# Patient Record
Sex: Female | Born: 1980
Health system: Southern US, Community
[De-identification: ages and names within clinical notes are randomized; demographics above are authoritative.]

## PROBLEM LIST (undated history)

## (undated) DIAGNOSIS — E669 Obesity, unspecified: Secondary | ICD-10-CM

## (undated) DIAGNOSIS — E66811 Obesity, class 1: Secondary | ICD-10-CM

## (undated) DIAGNOSIS — E282 Polycystic ovarian syndrome: Secondary | ICD-10-CM

## (undated) HISTORY — DX: Obesity, class 1: E66.811

## (undated) HISTORY — DX: Polycystic ovarian syndrome: E28.2

## (undated) HISTORY — DX: Obesity, unspecified: E66.9

---

## 2019-02-12 ENCOUNTER — Other Ambulatory Visit (HOSPITAL_COMMUNITY)
Admission: RE | Admit: 2019-02-12 | Discharge: 2019-02-12 | Disposition: A | Discharge status: Home / Self Care | Payer: BLUE CROSS/BLUE SHIELD | Source: Ambulatory Visit | Attending: Family Medicine | Admitting: Family Medicine

## 2019-02-12 ENCOUNTER — Encounter (INDEPENDENT_AMBULATORY_CARE_PROVIDER_SITE_OTHER): Payer: Self-pay

## 2019-02-12 ENCOUNTER — Ambulatory Visit: Payer: BLUE CROSS/BLUE SHIELD | Admitting: Family Medicine

## 2019-02-12 ENCOUNTER — Encounter: Payer: Self-pay | Admitting: Family Medicine

## 2019-02-12 VITALS — BP 120/68 | HR 87 | Temp 98.1°F | Resp 12 | Ht 68.0 in | Wt 226.8 lb

## 2019-02-12 DIAGNOSIS — Z23 Encounter for immunization: Secondary | ICD-10-CM | POA: Diagnosis not present

## 2019-02-12 DIAGNOSIS — N926 Irregular menstruation, unspecified: Secondary | ICD-10-CM

## 2019-02-12 DIAGNOSIS — E282 Polycystic ovarian syndrome: Secondary | ICD-10-CM | POA: Diagnosis not present

## 2019-02-12 DIAGNOSIS — Z0000 Encounter for general adult medical examination without abnormal findings: Secondary | ICD-10-CM | POA: Diagnosis not present

## 2019-02-12 DIAGNOSIS — Z113 Encounter for screening for infections with a predominantly sexual mode of transmission: Secondary | ICD-10-CM | POA: Diagnosis not present

## 2019-02-12 DIAGNOSIS — Z124 Encounter for screening for malignant neoplasm of cervix: Secondary | ICD-10-CM

## 2019-02-12 NOTE — Assessment & Plan Note (Signed)
Refer to GYN. 

## 2019-02-12 NOTE — Assessment & Plan Note (Signed)
Screening down

## 2019-02-12 NOTE — Patient Instructions (Addendum)
Check out the information at familydoctor.org entitled "Nutrition for Weight Loss: What You Need to Know about Fad Diets" Try to lose between 1-2 pounds per week by taking in fewer calories and burning off more calories You can succeed by limiting portions, limiting foods dense in calories and fat, becoming more active, and drinking 8 glasses of water a day (64 ounces) Don't skip meals, especially breakfast, as skipping meals may alter your metabolism Do not use over-the-counter weight loss pills or gimmicks that claim rapid weight loss A healthy BMI (or body mass index) is between 18.5 and 24.9 You can calculate your ideal BMI at the [place] website ClubMonetize.fr  Health Maintenance, Female Adopting a healthy lifestyle and getting preventive care can go a long way to promote health and wellness. Talk with your health care provider about what schedule of regular examinations is right for you. This is a good chance for you to check in with your provider about disease prevention and staying healthy. In between checkups, there are plenty of things you can do on your own. Experts have done a lot of research about which lifestyle changes and preventive measures are most likely to keep you healthy. Ask your health care provider for more information. Weight and diet Eat a healthy diet  Be sure to include plenty of vegetables, fruits, low-fat dairy products, and lean protein.  Do not eat a lot of foods high in solid fats, added sugars, or salt.  Get regular exercise. This is one of the most important things you can do for your health. ? Most adults should exercise for at least 150 minutes each week. The exercise should increase your heart rate and make you sweat (moderate-intensity exercise). ? Most adults should also do strengthening exercises at least twice a week. This is in addition to the moderate-intensity exercise. Maintain a healthy  weight  Body mass index (BMI) is a measurement that can be used to identify possible weight problems. It estimates body fat based on height and weight. Your health care provider can help determine your BMI and help you achieve or maintain a healthy weight.  For females 71 years of age and older: ? A BMI below 18.5 is considered underweight. ? A BMI of 18.5 to 24.9 is normal. ? A BMI of 25 to 29.9 is considered overweight. ? A BMI of 30 and above is considered obese. Watch levels of cholesterol and blood lipids  You should start having your blood tested for lipids and cholesterol at 38 years of age, then have this test every 5 years.  You may need to have your cholesterol levels checked more often if: ? Your lipid or cholesterol levels are high. ? You are older than 38 years of age. ? You are at high risk for heart disease. Cancer screening Lung Cancer  Lung cancer screening is recommended for adults 58-26 years old who are at high risk for lung cancer because of a history of smoking.  A yearly low-dose CT scan of the lungs is recommended for people who: ? Currently smoke. ? Have quit within the past 15 years. ? Have at least a 30-pack-year history of smoking. A pack year is smoking an average of one pack of cigarettes a day for 1 year.  Yearly screening should continue until it has been 15 years since you quit.  Yearly screening should stop if you develop a health problem that would prevent you from having lung cancer treatment. Breast Cancer  Practice breast self-awareness. This  means understanding how your breasts normally appear and feel.  It also means doing regular breast self-exams. Let your health care provider know about any changes, no matter how small.  If you are in your 20s or 30s, you should have a clinical breast exam (CBE) by a health care provider every 1-3 years as part of a regular health exam.  If you are 86 or older, have a CBE every year. Also consider  having a breast X-ray (mammogram) every year.  If you have a family history of breast cancer, talk to your health care provider about genetic screening.  If you are at high risk for breast cancer, talk to your health care provider about having an MRI and a mammogram every year.  Breast cancer gene (BRCA) assessment is recommended for women who have family members with BRCA-related cancers. BRCA-related cancers include: ? Breast. ? Ovarian. ? Tubal. ? Peritoneal cancers.  Results of the assessment will determine the need for genetic counseling and BRCA1 and BRCA2 testing. Cervical Cancer Your health care provider may recommend that you be screened regularly for cancer of the pelvic organs (ovaries, uterus, and vagina). This screening involves a pelvic examination, including checking for microscopic changes to the surface of your cervix (Pap test). You may be encouraged to have this screening done every 3 years, beginning at age 79.  For women ages 89-65, health care providers may recommend pelvic exams and Pap testing every 3 years, or they may recommend the Pap and pelvic exam, combined with testing for human papilloma virus (HPV), every 5 years. Some types of HPV increase your risk of cervical cancer. Testing for HPV may also be done on women of any age with unclear Pap test results.  Other health care providers may not recommend any screening for nonpregnant women who are considered low risk for pelvic cancer and who do not have symptoms. Ask your health care provider if a screening pelvic exam is right for you.  If you have had past treatment for cervical cancer or a condition that could lead to cancer, you need Pap tests and screening for cancer for at least 20 years after your treatment. If Pap tests have been discontinued, your risk factors (such as having a new sexual partner) need to be reassessed to determine if screening should resume. Some women have medical problems that increase the  chance of getting cervical cancer. In these cases, your health care provider may recommend more frequent screening and Pap tests. Colorectal Cancer  This type of cancer can be detected and often prevented.  Routine colorectal cancer screening usually begins at 38 years of age and continues through 38 years of age.  Your health care provider may recommend screening at an earlier age if you have risk factors for colon cancer.  Your health care provider may also recommend using home test kits to check for hidden blood in the stool.  A small camera at the end of a tube can be used to examine your colon directly (sigmoidoscopy or colonoscopy). This is done to check for the earliest forms of colorectal cancer.  Routine screening usually begins at age 3.  Direct examination of the colon should be repeated every 5-10 years through 38 years of age. However, you may need to be screened more often if early forms of precancerous polyps or small growths are found. Skin Cancer  Check your skin from head to toe regularly.  Tell your health care provider about any new moles or changes  in moles, especially if there is a change in a mole's shape or color.  Also tell your health care provider if you have a mole that is larger than the size of a pencil eraser.  Always use sunscreen. Apply sunscreen liberally and repeatedly throughout the day.  Protect yourself by wearing long sleeves, pants, a wide-brimmed hat, and sunglasses whenever you are outside. Heart disease, diabetes, and high blood pressure  High blood pressure causes heart disease and increases the risk of stroke. High blood pressure is more likely to develop in: ? People who have blood pressure in the high end of the normal range (130-139/85-89 mm Hg). ? People who are overweight or obese. ? People who are African American.  If you are 86-10 years of age, have your blood pressure checked every 3-5 years. If you are 19 years of age or older,  have your blood pressure checked every year. You should have your blood pressure measured twice-once when you are at a hospital or clinic, and once when you are not at a hospital or clinic. Record the average of the two measurements. To check your blood pressure when you are not at a hospital or clinic, you can use: ? An automated blood pressure machine at a pharmacy. ? A home blood pressure monitor.  If you are between 47 years and 42 years old, ask your health care provider if you should take aspirin to prevent strokes.  Have regular diabetes screenings. This involves taking a blood sample to check your fasting blood sugar level. ? If you are at a normal weight and have a low risk for diabetes, have this test once every three years after 38 years of age. ? If you are overweight and have a high risk for diabetes, consider being tested at a younger age or more often. Preventing infection Hepatitis B  If you have a higher risk for hepatitis B, you should be screened for this virus. You are considered at high risk for hepatitis B if: ? You were born in a country where hepatitis B is common. Ask your health care provider which countries are considered high risk. ? Your parents were born in a high-risk country, and you have not been immunized against hepatitis B (hepatitis B vaccine). ? You have HIV or AIDS. ? You use needles to inject street drugs. ? You live with someone who has hepatitis B. ? You have had sex with someone who has hepatitis B. ? You get hemodialysis treatment. ? You take certain medicines for conditions, including cancer, organ transplantation, and autoimmune conditions. Hepatitis C  Blood testing is recommended for: ? Everyone born from [birth year] through 1965. ? Anyone with known risk factors for hepatitis C. Sexually transmitted infections (STIs)  You should be screened for sexually transmitted infections (STIs) including gonorrhea and chlamydia if: ? You are sexually active  and are younger than 38 years of age. ? You are older than 39 years of age and your health care provider tells you that you are at risk for this type of infection. ? Your sexual activity has changed since you were last screened and you are at an increased risk for chlamydia or gonorrhea. Ask your health care provider if you are at risk.  If you do not have HIV, but are at risk, it may be recommended that you take a prescription medicine daily to prevent HIV infection. This is called pre-exposure prophylaxis (PrEP). You are considered at risk if: ? You are sexually active  and do not regularly use condoms or know the HIV status of your partner(s). ? You take drugs by injection. ? You are sexually active with a partner who has HIV. Talk with your health care provider about whether you are at high risk of being infected with HIV. If you choose to begin PrEP, you should first be tested for HIV. You should then be tested every 3 months for as long as you are taking PrEP. Pregnancy  If you are premenopausal and you may become pregnant, ask your health care provider about preconception counseling.  If you may become pregnant, take 400 to 800 micrograms (mcg) of folic acid every day.  If you want to prevent pregnancy, talk to your health care provider about birth control (contraception). Osteoporosis and menopause  Osteoporosis is a disease in which the bones lose minerals and strength with aging. This can result in serious bone fractures. Your risk for osteoporosis can be identified using a bone density scan.  If you are 45 years of age or older, or if you are at risk for osteoporosis and fractures, ask your health care provider if you should be screened.  Ask your health care provider whether you should take a calcium or vitamin D supplement to lower your risk for osteoporosis.  Menopause may have certain physical symptoms and risks.  Hormone replacement therapy may reduce some of these symptoms  and risks. Talk to your health care provider about whether hormone replacement therapy is right for you. Follow these instructions at home:  Schedule regular health, dental, and eye exams.  Stay current with your immunizations.  Do not use any tobacco products including cigarettes, chewing tobacco, or electronic cigarettes.  If you are pregnant, do not drink alcohol.  If you are breastfeeding, limit how much and how often you drink alcohol.  Limit alcohol intake to no more than 1 drink per day for nonpregnant women. One drink equals 12 ounces of beer, 5 ounces of wine, or 1 ounces of hard liquor.  Do not use street drugs.  Do not share needles.  Ask your health care provider for help if you need support or information about quitting drugs.  Tell your health care provider if you often feel depressed.  Tell your health care provider if you have ever been abused or do not feel safe at home. This information is not intended to replace advice given to you by your health care provider. Make sure you discuss any questions you have with your health care provider. Document Released: 06/10/2011 Document Revised: 05/02/2016 Document Reviewed: 08/29/2015 Elsevier Interactive Patient Education  2019 [employer]  Preventing Unhealthy [employer], Adult Staying at a healthy weight is important to your overall health. When fat builds up in your body, you may become overweight or obese. Being overweight or obese increases your risk of developing certain health problems, such as heart disease, diabetes, sleeping problems, joint problems, and some types of cancer. Unhealthy weight gain is often the result of making unhealthy food choices or not getting enough exercise. You can make changes to your lifestyle to prevent obesity and stay as healthy as possible. What nutrition changes can be made?   Eat only as much as your body needs. To do this: ? Pay attention to signs that you are hungry or  full. Stop eating as soon as you feel full. ? If you feel hungry, try drinking water first before eating. Drink enough water so your urine is clear or pale yellow. ?  Eat smaller portions. Pay attention to portion sizes when eating out. ? Look at serving sizes on food labels. Most foods contain more than one serving per container. ? Eat the recommended number of calories for your gender and activity level. For most active people, a daily total of 2,000 calories is appropriate. If you are trying to lose weight or are not very active, you may need to eat fewer calories. Talk with your health care provider or a diet and nutrition specialist (dietitian) about how many calories you need each day.  Choose healthy foods, such as: ? Fruits and vegetables. At each meal, try to fill at least half of your plate with fruits and vegetables. ? Whole grains, such as whole-wheat bread, brown rice, and quinoa. ? Lean meats, such as chicken or fish. ? Other healthy proteins, such as beans, eggs, or tofu. ? Healthy fats, such as nuts, seeds, fatty fish, and olive oil. ? Low-fat or fat-free dairy products.  Check food labels, and avoid food and drinks that: ? Are high in calories. ? Have added sugar. ? Are high in sodium. ? Have saturated fats or trans fats.  Cook foods in healthier ways, such as by baking, broiling, or grilling.  Make a meal plan for the week, and shop with a grocery list to help you stay on track with your purchases. Try to avoid going to the grocery store when you are hungry.  When grocery shopping, try to shop around the outside of the store first, where the fresh foods are. Doing this helps you to avoid prepackaged foods, which can be high in sugar, salt (sodium), and fat. What lifestyle changes can be made?   Exercise for 30 or more minutes on 5 or more days each week. Exercising may include brisk walking, yard work, biking, running, swimming, and team sports like basketball and soccer.  Ask your health care provider which exercises are safe for you.  Do muscle-strengthening activities, such as lifting weights or using resistance bands, on 2 or more days a week.  Do not use any products that contain nicotine or tobacco, such as cigarettes and e-cigarettes. If you need help quitting, ask your health care provider.  Limit alcohol intake to no more than 1 drink a day for nonpregnant women and 2 drinks a day for men. One drink equals 12 oz of beer, 5 oz of wine, or 1 oz of hard liquor.  Try to get 7-9 hours of sleep each night. What other changes can be made?  Keep a food and activity journal to keep track of: ? What you ate and how many calories you had. Remember to count the calories in sauces, dressings, and side dishes. ? Whether you were active, and what exercises you did. ? Your calorie, weight, and activity goals.  Check your weight regularly. Track any changes. If you notice you have gained weight, make changes to your diet or activity routine.  Avoid taking weight-loss medicines or supplements. Talk to your health care provider before starting any new medicine or supplement.  Talk to your health care provider before trying any new diet or exercise plan. Why are these changes important? Eating healthy, staying active, and having healthy habits can help you to prevent obesity. Those changes also:  Help you manage stress and emotions.  Help you connect with friends and family.  Improve your self-esteem.  Improve your sleep.  Prevent long-term health problems. What can happen if changes are not made? Being obese or  overweight can cause you to develop joint or bone problems, which can make it hard for you to stay active or do activities you enjoy. Being obese or overweight also puts stress on your heart and lungs and can lead to health problems like diabetes, heart disease, and some cancers. Where to find more information Talk with your health care provider or a  dietitian about healthy eating and healthy lifestyle choices. You may also find information from:  [place] Department of Agriculture, MyPlate: FormerBoss.no  American Heart Association: www.heart.org  Centers for Disease Control and Prevention: http://www.wolf.info/ Summary  Staying at a healthy weight is important to your overall health. It helps you to prevent certain diseases and health problems, such as heart disease, diabetes, joint problems, sleep disorders, and some types of cancer.  Being obese or overweight can cause you to develop joint or bone problems, which can make it hard for you to stay active or do activities you enjoy.  You can prevent unhealthy weight gain by eating a healthy diet, exercising regularly, not smoking, limiting alcohol, and getting enough sleep.  Talk with your health care provider or a dietitian for guidance about healthy eating and healthy lifestyle choices. This information is not intended to replace advice given to you by your health care provider. Make sure you discuss any questions you have with your health care provider. Document Released: 11/26/2016 Document Revised: 09/05/2017 Document Reviewed: 01/01/2017 Elsevier Interactive Patient Education  2019 [employer].

## 2019-02-12 NOTE — Progress Notes (Signed)
BP 120/68   Pulse 87   Temp 98.1 F (36.7 C) (Oral)   Resp 12   Ht _0  (1.727 m)   Wt 226 lb 12.8 oz (102.9 kg)   SpO2 94%   BMI 34.48 kg/m    Subjective:    Patient ID: Misty Reyes, female    DOB: 1981/04/09, 38 y.o.   MRN: [id number]  HPI: Misty Reyes is a 38 y.o. female  Chief Complaint  Patient presents with  . Establish Care    HPI Patient is here to establish care Positive family hx of diabetes; stays hydrated so urinates a lot; cut out juice; lots of water No blurred vision and no dry mouth Neither parent has diabetes Sister does not have diabetes Brother does not have diabetes Big breakfast this morning but no lunch  Obesity; "I need to eeercise more" She is trying to watch her eating better PCOS, "my period is off the charts"; they did US of the ovaries and she had the cysts She has the hair on face Just came on last week; not sexually active now Five day periods, always five days, heavier at the beginning and then taper Last year, it was fine, then October did not come until end of February; once skipped 6 months  USPSTF grade A and B recommendations Depression:  Depression screen [facility] 2/9 02/12/2019  Decreased Interest 0  Down, Depressed, Hopeless 0  PHQ - 2 Score 0  Altered sleeping 0  Tired, decreased energy 0  Change in appetite 0  Feeling bad or failure about yourself  0  Trouble concentrating 0  Moving slowly or fidgety/restless 0  Suicidal thoughts 0  PHQ-9 Score 0  Difficult doing work/chores Not difficult at all   Hypertension: BP Readings from Last 3 Encounters:  02/12/19 120/68   Obesity: goal 180 pounds Wt Readings from Last 3 Encounters:  02/12/19 226 lb 12.8 oz (102.9 kg)   BMI Readings from Last 3 Encounters:  02/12/19 34.48 kg/m     Skin cancer: nothing worrisome Lung cancer:  Never smoker Breast cancer: no lumps Colorectal cancer: no fam hx Cervical cancer screening: no abnormal pap smear; more than 5 years  ago BRCA gene screening: family hx of breast and/or ovarian cancer and/or metastatic prostate cancer? no HIV, hep B, hep C: check those STD testing and prevention (chl/gon/syphilis): check those Intimate partner violence: no abuse Contraception: not sexually active now Immunizations: tetanus and flu today Diet: trying to work on that Exercise: increase slowly and gradually Alcohol:    [facility] Visit from 02/12/2019 in [facility]  AUDIT-C Score  0     Tobacco use: never   Depression screen [facility] 2/9 02/12/2019  Decreased Interest 0  Down, Depressed, Hopeless 0  PHQ - 2 Score 0  Altered sleeping 0  Tired, decreased energy 0  Change in appetite 0  Feeling bad or failure about yourself  0  Trouble concentrating 0  Moving slowly or fidgety/restless 0  Suicidal thoughts 0  PHQ-9 Score 0  Difficult doing work/chores Not difficult at all   Fall Risk  02/12/2019  Falls in the past year? 0  Number falls in past yr: 0  Injury with Fall? 0    Relevant past medical, surgical, family and social history reviewed Past Medical History:  Diagnosis Date  . Obesity (BMI 30.0-34.9)     Never had any surgery  History reviewed. No pertinent surgical history.   Family History  Problem Relation Age  of Onset  . Diabetes Paternal Grandmother    Social History   Tobacco Use  . Smoking status: Never Smoker  . Smokeless tobacco: Never Used  Substance Use Topics  . Alcohol use: Never    Frequency: Never  . Drug use: Never     Office Visit from 02/12/2019 in [facility]  AUDIT-C Score  0      Interim medical history since last visit reviewed. Allergies and medications reviewed  Review of Systems Per HPI unless specifically indicated above     Objective:    BP 120/68   Pulse 87   Temp 98.1 F (36.7 C) (Oral)   Resp 12   Ht _0  (1.727 m)   Wt 226 lb 12.8 oz (102.9 kg)   SpO2 94%   BMI 34.48 kg/m   Wt Readings from Last 3 Encounters:   02/12/19 226 lb 12.8 oz (102.9 kg)    Physical Exam Constitutional:      General: She is not in acute distress.    Appearance: She is well-developed. She is obese.  HENT:     Right Ear: Hearing, tympanic membrane, ear canal and external ear normal.     Left Ear: Hearing, tympanic membrane, ear canal and external ear normal.     Mouth/Throat:     Pharynx: No posterior oropharyngeal erythema.  Eyes:     General: No scleral icterus.       Right eye: No hordeolum.        Left eye: No hordeolum.     Conjunctiva/sclera: Conjunctivae normal.  Neck:     Thyroid: No thyromegaly.     Vascular: No carotid bruit.  Cardiovascular:     Rate and Rhythm: Normal rate and regular rhythm.  No extrasystoles are present.    Heart sounds: Normal heart sounds, S1 normal and S2 normal.  Pulmonary:     Effort: Pulmonary effort is normal. No respiratory distress.     Breath sounds: Normal breath sounds.  Abdominal:     General: Bowel sounds are normal. There is no distension or abdominal bruit.     Palpations: Abdomen is soft. There is no mass or pulsatile mass.     Tenderness: There is no abdominal tenderness.     Hernia: No hernia is present.  Musculoskeletal: Normal range of motion.  Lymphadenopathy:     Head:     Right side of head: No submandibular adenopathy.     Left side of head: No submandibular adenopathy.     Cervical: No cervical adenopathy.  Skin:    General: Skin is warm and dry.     Coloration: Skin is not pale.     Findings: No bruising or ecchymosis.  Neurological:     Mental Status: She is alert.     Motor: No tremor or abnormal muscle tone.     Gait: Gait normal.     Deep Tendon Reflexes:     Reflex Scores:      Patellar reflexes are 2+ on the right side and 2+ on the left side. Psychiatric:        Mood and Affect: Mood is not anxious or depressed.        Speech: Speech normal.        Behavior: Behavior normal.        Thought Content: Thought content normal.         Assessment & Plan:   Problem List Items Addressed This Visit  Other   Screen for STD (sexually transmitted disease)    Screening down      Relevant Orders   HIV Antibody (routine testing w rflx) (Completed)   Hepatitis panel, acute   HIV Antibody (routine testing w rflx)   RPR   Cervicovaginal ancillary only   Irregular periods    Refer to GYN      Relevant Orders   Ambulatory referral to Obstetrics / Gynecology   Preventative health care - Primary    USPSTF grade A and B recommendations reviewed with patient; age-appropriate recommendations, preventive care, screening tests, etc discussed and encouraged; healthy living encouraged; see AVS for patient education given to patient GYN components requested to be done by GYN      Relevant Orders   CBC (Completed)   COMPLETE METABOLIC PANEL WITH GFR (Completed)   TSH (Completed)   Lipid panel (Completed)    Other Visit Diagnoses    Need for Tdap vaccination       Relevant Orders   Tdap vaccine greater than or equal to 7yo IM (Completed)   Need for influenza vaccination       Relevant Orders   Flu Vaccine QUAD 6+ mos PF IM (Fluarix Quad PF) (Completed)   PCOS (polycystic ovarian syndrome)       Relevant Orders   Ambulatory referral to Obstetrics / Gynecology   Cervical cancer screening       Relevant Orders   Ambulatory referral to Obstetrics / Gynecology       Follow up plan: Return in about 1 year (around 02/12/2020) for complete physical.  An after-visit summary was printed and given to the patient at [facility].  Please see the patient instructions which may contain other information and recommendations beyond what is mentioned above in the assessment and plan.  No orders of the defined types were placed in this encounter.   Orders Placed This Encounter  Procedures  . Tdap vaccine greater than or equal to 7yo IM  . Flu Vaccine QUAD 6+ mos PF IM (Fluarix Quad PF)  . CBC  . COMPLETE METABOLIC PANEL WITH GFR   . HIV Antibody (routine testing w rflx)  . TSH  . Lipid panel  . Hepatitis panel, acute  . HIV Antibody (routine testing w rflx)  . RPR  . Ambulatory referral to Obstetrics / Gynecology

## 2019-02-14 ENCOUNTER — Encounter: Payer: Self-pay | Admitting: Family Medicine

## 2019-02-14 NOTE — Assessment & Plan Note (Signed)
USPSTF grade A and B recommendations reviewed with patient; age-appropriate recommendations, preventive care, screening tests, etc discussed and encouraged; healthy living encouraged; see AVS for patient education given to patient GYN components requested to be done by GYN

## 2019-02-15 LAB — COMPLETE METABOLIC PANEL WITH GFR
AG Ratio: 1.3 (calc) (ref 1.0–2.5)
ALKALINE PHOSPHATASE (APISO): 53 U/L (ref 31–125)
ALT: 14 U/L (ref 6–29)
AST: 18 U/L (ref 10–30)
Albumin: 4.2 g/dL (ref 3.6–5.1)
BUN: 9 mg/dL (ref 7–25)
CO2: 28 mmol/L (ref 20–32)
Calcium: 9.6 mg/dL (ref 8.6–10.2)
Chloride: 105 mmol/L (ref 98–110)
Creat: 0.93 mg/dL (ref 0.50–1.10)
GFR, Est African American: 91 mL/min/{1.73_m2} (ref >=60)
GFR, Est Non African American: 79 mL/min/{1.73_m2} (ref >=60)
Globulin: 3.2 g/dL (calc) (ref 1.9–3.7)
Glucose, Bld: 88 mg/dL (ref 65–99)
Potassium: 3.9 mmol/L (ref 3.5–5.3)
Sodium: 139 mmol/L (ref 135–146)
Total Bilirubin: 0.7 mg/dL (ref 0.2–1.2)
Total Protein: 7.4 g/dL (ref 6.1–8.1)

## 2019-02-15 LAB — HIV ANTIBODY (ROUTINE TESTING W REFLEX): HIV 1&2 Ab, 4th Generation: NONREACTIVE

## 2019-02-15 LAB — CBC
HCT: 37.7 % (ref 35.0–45.0)
Hemoglobin: 12.5 g/dL (ref 11.7–15.5)
MCH: 28.4 pg (ref 27.0–33.0)
MCHC: 33.2 g/dL (ref 32.0–36.0)
MCV: 85.7 fL (ref 80.0–100.0)
MPV: 11.1 fL (ref 7.5–12.5)
Platelets: 271 10*3/uL (ref 140–400)
RBC: 4.4 10*6/uL (ref 3.80–5.10)
RDW: 12.5 % (ref 11.0–15.0)
WBC: 4.6 10*3/uL (ref 3.8–10.8)

## 2019-02-15 LAB — LIPID PANEL
Cholesterol: 193 mg/dL (ref <200)
HDL: 45 mg/dL — AB (ref >=50)
LDL Cholesterol (Calc): 132 mg/dL (calc) — ABNORMAL HIGH
Non-HDL Cholesterol (Calc): 148 mg/dL (calc) — ABNORMAL HIGH (ref <130)
Total CHOL/HDL Ratio: 4.3 (calc) (ref <5.0)
Triglycerides: 66 mg/dL (ref <150)

## 2019-02-15 LAB — HEPATITIS PANEL, ACUTE
HEP B C IGM: NONREACTIVE
Hep A IgM: NONREACTIVE
Hepatitis B Surface Ag: NONREACTIVE
Hepatitis C Ab: NONREACTIVE
SIGNAL TO CUT-OFF: 0.15 (ref <1.00)

## 2019-02-15 LAB — RPR: RPR Ser Ql: NONREACTIVE

## 2019-02-15 LAB — TSH: TSH: 1.15 mIU/L

## 2019-02-15 LAB — CERVICOVAGINAL ANCILLARY ONLY
Chlamydia: NEGATIVE
Neisseria Gonorrhea: NEGATIVE
Trichomonas: NEGATIVE

## 2019-02-16 ENCOUNTER — Telehealth: Payer: Self-pay | Admitting: Obstetrics & Gynecology

## 2019-02-16 NOTE — Telephone Encounter (Signed)
[facility] referring for PCOS, irregular periods, cervical cancer screen, breast exam

## 2019-03-01 ENCOUNTER — Encounter: Payer: Self-pay | Admitting: Obstetrics and Gynecology

## 2019-03-03 NOTE — Telephone Encounter (Signed)
Appointment was cancel due COVID virus pandemic. Left voicemail for patient to call back to reschedule 2 months out due to covid virus. Called and left voicemail for patient to call back to be schedule

## 2019-03-25 ENCOUNTER — Ambulatory Visit (INDEPENDENT_AMBULATORY_CARE_PROVIDER_SITE_OTHER): Payer: BLUE CROSS/BLUE SHIELD | Admitting: Obstetrics and Gynecology

## 2019-03-25 ENCOUNTER — Other Ambulatory Visit: Payer: Self-pay

## 2019-03-25 ENCOUNTER — Encounter: Payer: Self-pay | Admitting: Obstetrics and Gynecology

## 2019-03-25 ENCOUNTER — Other Ambulatory Visit (HOSPITAL_COMMUNITY)
Admission: RE | Admit: 2019-03-25 | Discharge: 2019-03-25 | Disposition: A | Discharge status: Home / Self Care | Payer: BLUE CROSS/BLUE SHIELD | Source: Ambulatory Visit | Attending: Obstetrics and Gynecology | Admitting: Obstetrics and Gynecology

## 2019-03-25 VITALS — BP 140/84 | HR 76 | Ht 70.0 in | Wt 233.0 lb

## 2019-03-25 DIAGNOSIS — Z124 Encounter for screening for malignant neoplasm of cervix: Secondary | ICD-10-CM | POA: Insufficient documentation

## 2019-03-25 DIAGNOSIS — Z01419 Encounter for gynecological examination (general) (routine) without abnormal findings: Secondary | ICD-10-CM

## 2019-03-25 DIAGNOSIS — Z1151 Encounter for screening for human papillomavirus (HPV): Secondary | ICD-10-CM

## 2019-03-25 DIAGNOSIS — E282 Polycystic ovarian syndrome: Secondary | ICD-10-CM | POA: Insufficient documentation

## 2019-03-25 MED ORDER — MEDROXYPROGESTERONE ACETATE 10 MG PO TABS: 10.0000 mg | ORAL_TABLET | Freq: Every day | ORAL | 0 refills | Status: DC

## 2019-03-25 NOTE — Patient Instructions (Signed)
I value your feedback and entrusting us with your care. If you get a [place] patient survey, I would appreciate you taking the time to let us know about your experience today. Thank you! 

## 2019-03-25 NOTE — Progress Notes (Signed)
PCP:  Kerman PasseyLada, Melinda P, MD   Chief Complaint  Patient presents with  . Pap only  . Breast exam    has never had a breast exam  . Metrorrhagia    sometimes gets 2 periods a yr, other times a period every 3 months     HPI:      Ms. Misty Reyes is a 38 y.o. [id number] who LMP was Patient's last menstrual period was 03/08/2019 (exact date)., presents today for her NP annual examination, referred by PCP for irreg menses, pap and breast exam.  Her menses are Q3-6 months due to PCOS, lasting 5 days, light flow. Sx since 2006.  Dysmenorrhea mild, occurring first 1-2 days of flow. She does not have intermenstrual bleeding. Has hirsutism and cysts on ovaries on u/s, per pt report. Has never done any tx for it. Is not interested in birth control/hormones for cycle control.  Sex activity: not sexually active.  Last Pap: not recent; no hx of abn Hx of STDs: none; neg STD testing 3/20 with PCP  There is no FH of breast cancer. There is no FH of ovarian cancer. The patient does do self-breast exams.  Tobacco use: The patient denies current or previous tobacco use. Alcohol use: none No drug use.  Exercise: not active  She does get adequate calcium but not Vitamin D in her diet. Labs with PCP. Elevated LDL.  Past Medical History:  Diagnosis Date  . Obesity (BMI 30.0-34.9)   . PCOS (polycystic ovarian syndrome)     History reviewed. No pertinent surgical history.  Family History  Problem Relation Age of Onset  . Diabetes Paternal Grandmother   . Breast cancer Neg Hx   . Ovarian cancer Neg Hx     Social History   Socioeconomic History  . Marital status: Single    Spouse name: Not on file  . Number of children: Not on file  . Years of education: 1012  . Highest education level: Bachelor's degree (e.g., BA, AB, BS)  Occupational History  . Not on file  Social Needs  . Financial resource strain: Not hard at all  . Food insecurity:    Worry: Never true    Inability: Never true  .  Transportation needs:    Medical: No    Non-medical: No  Tobacco Use  . Smoking status: Never Smoker  . Smokeless tobacco: Never Used  Substance and Sexual Activity  . Alcohol use: Never    Frequency: Never  . Drug use: Never  . Sexual activity: Not Currently    Birth control/protection: None  Lifestyle  . Physical activity:    Days per week: 0 days    Minutes per session: 0 min  . Stress: Not at all  Relationships  . Social connections:    Talks on phone: Once a week    Gets together: Once a week    Attends religious service: Never    Active member of club or organization: No    Attends meetings of clubs or organizations: Never    Relationship status: Not on file  . Intimate partner violence:    Fear of current or ex partner: No    Emotionally abused: No    Physically abused: No    Forced sexual activity: No  Other Topics Concern  . Not on file  Social History Narrative  . Not on file    No outpatient medications prior to visit.   No facility-administered medications prior to visit.  ROS:  Review of Systems  Constitutional: Negative for fatigue, fever and unexpected weight change.  Respiratory: Negative for cough, shortness of breath and wheezing.   Cardiovascular: Negative for chest pain, palpitations and leg swelling.  Gastrointestinal: Negative for blood in stool, constipation, diarrhea, nausea and vomiting.  Endocrine: Negative for cold intolerance, heat intolerance and polyuria.  Genitourinary: Positive for menstrual problem. Negative for dyspareunia, dysuria, flank pain, frequency, genital sores, hematuria, pelvic pain, urgency, vaginal bleeding, vaginal discharge and vaginal pain.  Musculoskeletal: Negative for back pain, joint swelling and myalgias.  Skin: Negative for rash.  Neurological: Negative for dizziness, syncope, light-headedness, numbness and headaches.  Hematological: Negative for adenopathy.  Psychiatric/Behavioral: Negative for  agitation, confusion, sleep disturbance and suicidal ideas. The patient is not nervous/anxious.   BREAST: No symptoms   Objective: BP 140/84   Pulse 76   Ht 5\' 10"  (1.778 m)   Wt 233 lb (105.7 kg)   LMP 03/08/2019 (Exact Date)   BMI 33.43 kg/m    Physical Exam Constitutional:      Appearance: She is well-developed.  Genitourinary:     Vulva, vagina, cervix, uterus, right adnexa and left adnexa normal.     No vulval lesion or tenderness noted.     No vaginal discharge, erythema or tenderness.     No cervical polyp.     Uterus is not enlarged or tender.     No right or left adnexal mass present.     Right adnexa not tender.     Left adnexa not tender.  Neck:     Musculoskeletal: Normal range of motion.     Thyroid: No thyromegaly.  Cardiovascular:     Rate and Rhythm: Normal rate and regular rhythm.     Heart sounds: Normal heart sounds. No murmur.  Pulmonary:     Effort: Pulmonary effort is normal.     Breath sounds: Normal breath sounds.  Chest:     Breasts:        Right: No mass, nipple discharge, skin change or tenderness.        Left: No mass, nipple discharge, skin change or tenderness.  Abdominal:     Palpations: Abdomen is soft.     Tenderness: There is no abdominal tenderness. There is no guarding.  Musculoskeletal: Normal range of motion.  Neurological:     General: No focal deficit present.     Mental Status: She is alert and oriented to person, place, and time.     Cranial Nerves: No cranial nerve deficit.  Skin:    General: Skin is warm and dry.  Psychiatric:        Mood and Affect: Mood normal.        Behavior: Behavior normal.        Thought Content: Thought content normal.        Judgment: Judgment normal.  Vitals signs reviewed.     Assessment/Plan: Encounter for annual routine gynecological examination  Cervical cancer screening - Plan: Cytology - PAP  Screening for HPV (human papillomavirus) - Plan: Cytology - PAP  PCOS (polycystic  ovarian syndrome) - With irreg menses. Pt declines BC for mgmt. Will do Rx provera Q90 days for withdrawal bleed if no spont menses. Rx eRxd. Pt understands how to use. F/u prn.  - Plan: medroxyPROGESTERone (PROVERA) 10 MG tablet  Meds ordered this encounter  Medications  . medroxyPROGESTERone (PROVERA) 10 MG tablet    Sig: Take 1 tablet (10 mg total) by mouth daily for 7  days. Q90 days if no spontaneous menses    Dispense:  28 tablet    Refill:  0    Order Specific Question:   Supervising Provider    Answer:   Nadara Mustard [[id number]]             GYN counsel adequate intake of calcium and vitamin D, diet and exercise     F/U  Return in about 1 year (around 03/24/2020).  Renessa Wellnitz B. Laisha Rau, PA-C 03/25/2019 10:46 AM

## 2019-03-27 LAB — CYTOLOGY - PAP
Diagnosis: NEGATIVE
HPV: DETECTED — AB

## 2019-03-30 ENCOUNTER — Telehealth: Payer: Self-pay | Admitting: Obstetrics and Gynecology

## 2019-03-31 ENCOUNTER — Encounter: Payer: Self-pay | Admitting: Obstetrics and Gynecology

## 2019-04-05 NOTE — Telephone Encounter (Signed)
LMTRC re: pap results.  

## 2020-02-10 ENCOUNTER — Encounter: Payer: Self-pay | Admitting: Nurse Practitioner

## 2020-02-10 ENCOUNTER — Ambulatory Visit (INDEPENDENT_AMBULATORY_CARE_PROVIDER_SITE_OTHER): Payer: BC Managed Care – PPO | Admitting: Nurse Practitioner

## 2020-02-10 DIAGNOSIS — E282 Polycystic ovarian syndrome: Secondary | ICD-10-CM

## 2020-02-10 DIAGNOSIS — E669 Obesity, unspecified: Secondary | ICD-10-CM | POA: Diagnosis not present

## 2020-02-10 DIAGNOSIS — Z0000 Encounter for general adult medical examination without abnormal findings: Secondary | ICD-10-CM | POA: Diagnosis not present

## 2020-02-10 DIAGNOSIS — Z7689 Persons encountering health services in other specified circumstances: Secondary | ICD-10-CM | POA: Diagnosis not present

## 2020-02-10 NOTE — Patient Instructions (Signed)
Healthy Eating Following a healthy eating pattern may help you to achieve and maintain a healthy body weight, reduce the risk of chronic disease, and live a long and productive life. It is important to follow a healthy eating pattern at an appropriate calorie level for your body. Your nutritional needs should be met primarily through food by choosing a variety of nutrient-rich foods. What are tips for following this plan? Reading food labels  Read labels and choose the following: ? Reduced or low sodium. ? Juices with 100% fruit juice. ? Foods with low saturated fats and high polyunsaturated and monounsaturated fats. ? Foods with whole grains, such as whole wheat, cracked wheat, brown rice, and wild rice. ? Whole grains that are fortified with folic acid. This is recommended for women who are pregnant or who want to become pregnant.  Read labels and avoid the following: ? Foods with a lot of added sugars. These include foods that contain brown sugar, corn sweetener, corn syrup, dextrose, fructose, glucose, high-fructose corn syrup, honey, invert sugar, lactose, malt syrup, maltose, molasses, raw sugar, sucrose, trehalose, or turbinado sugar.  Do not eat more than the following amounts of added sugar per day:  6 teaspoons (25 g) for women.  9 teaspoons (38 g) for men. ? Foods that contain processed or refined starches and grains. ? Refined grain products, such as white flour, degermed cornmeal, white bread, and white rice. Shopping  Choose nutrient-rich snacks, such as vegetables, whole fruits, and nuts. Avoid high-calorie and high-sugar snacks, such as potato chips, fruit snacks, and candy.  Use oil-based dressings and spreads on foods instead of solid fats such as butter, stick margarine, or cream cheese.  Limit pre-made sauces, mixes, and "instant" products such as flavored rice, instant noodles, and ready-made pasta.  Try more plant-protein sources, such as tofu, tempeh, black beans,  edamame, lentils, nuts, and seeds.  Explore eating plans such as the Mediterranean diet or vegetarian diet. Cooking  Use oil to saut or stir-fry foods instead of solid fats such as butter, stick margarine, or lard.  Try baking, boiling, grilling, or broiling instead of frying.  Remove the fatty part of meats before cooking.  Steam vegetables in water or broth. Meal planning   At meals, imagine dividing your plate into fourths: ? One-half of your plate is fruits and vegetables. ? One-fourth of your plate is whole grains. ? One-fourth of your plate is protein, especially lean meats, poultry, eggs, tofu, beans, or nuts.  Include low-fat dairy as part of your daily diet. Lifestyle  Choose healthy options in all settings, including home, work, school, restaurants, or stores.  Prepare your food safely: ? Wash your hands after handling raw meats. ? Keep food preparation surfaces clean by regularly washing with hot, soapy water. ? Keep raw meats separate from ready-to-eat foods, such as fruits and vegetables. ? Cook seafood, meat, poultry, and eggs to the recommended internal temperature. ? Store foods at safe temperatures. In general:  Keep cold foods at 59F (4.4C) or below.  Keep hot foods at 159F (60C) or above.  Keep your freezer at [facility] (-17.8C) or below.  Foods are no longer safe to eat when they have been between the temperatures of 40-159F (4.4-60C) for more than 2 hours. What foods should I eat? Fruits Aim to eat 2 cup-equivalents of fresh, canned (in natural juice), or frozen fruits each day. Examples of 1 cup-equivalent of fruit include 1 small apple, 8 large strawberries, 1 cup canned fruit,  cup  dried fruit, or 1 cup 100% juice. Vegetables Aim to eat 2-3 cup-equivalents of fresh and frozen vegetables each day, including different varieties and colors. Examples of 1 cup-equivalent of vegetables include 2 medium carrots, 2 cups raw, leafy greens, 1 cup chopped  vegetable (raw or cooked), or 1 medium baked potato. Grains Aim to eat 6 ounce-equivalents of whole grains each day. Examples of 1 ounce-equivalent of grains include 1 slice of bread, 1 cup ready-to-eat cereal, 3 cups popcorn, or  cup cooked rice, pasta, or cereal. Meats and other proteins Aim to eat 5-6 ounce-equivalents of protein each day. Examples of 1 ounce-equivalent of protein include 1 egg, 1/2 cup nuts or seeds, or 1 tablespoon (16 g) peanut butter. A cut of meat or fish that is the size of a deck of cards is about 3-4 ounce-equivalents.  Of the protein you eat each week, try to have at least 8 ounces come from seafood. This includes salmon, trout, herring, and anchovies. Dairy Aim to eat 3 cup-equivalents of fat-free or low-fat dairy each day. Examples of 1 cup-equivalent of dairy include 1 cup (240 mL) milk, 8 ounces (250 g) yogurt, 1 ounces (44 g) natural cheese, or 1 cup (240 mL) fortified soy milk. Fats and oils  Aim for about 5 teaspoons (21 g) per day. Choose monounsaturated fats, such as canola and olive oils, avocados, peanut butter, and most nuts, or polyunsaturated fats, such as sunflower, corn, and soybean oils, walnuts, pine nuts, sesame seeds, sunflower seeds, and flaxseed. Beverages  Aim for six 8-oz glasses of water per day. Limit coffee to three to five 8-oz cups per day.  Limit caffeinated beverages that have added calories, such as soda and energy drinks.  Limit alcohol intake to no more than 1 drink a day for nonpregnant women and 2 drinks a day for men. One drink equals 12 oz of beer (355 mL), 5 oz of wine (148 mL), or 1 oz of hard liquor (44 mL). Seasoning and other foods  Avoid adding excess amounts of salt to your foods. Try flavoring foods with herbs and spices instead of salt.  Avoid adding sugar to foods.  Try using oil-based dressings, sauces, and spreads instead of solid fats. This information is based on general [place] nutrition guidelines. For more  information, visit [employer]. Exact amounts may vary based on your nutrition needs. Summary  A healthy eating plan may help you to maintain a healthy weight, reduce the risk of chronic diseases, and stay active throughout your life.  Plan your meals. Make sure you eat the right portions of a variety of nutrient-rich foods.  Try baking, boiling, grilling, or broiling instead of frying.  Choose healthy options in all settings, including home, work, school, restaurants, or stores. This information is not intended to replace advice given to you by your health care provider. Make sure you discuss any questions you have with your health care provider. Document Revised: 03/09/2018 Document Reviewed: 03/09/2018 Elsevier Patient Education  [place].

## 2020-02-10 NOTE — Progress Notes (Signed)
New Patient Office Visit  Subjective:  Patient ID: Misty Reyes, female    DOB: November 20, 1981  Age: 39 y.o. MRN: [id number]  CC:  Chief Complaint  Patient presents with  . Establish Care    no concerns    . This visit was completed via MyChart due to the restrictions of the COVID-19 pandemic. All issues as above were discussed and addressed. Physical exam was done as above through visual confirmation on MyChart. If it was felt that the patient should be evaluated in the office, they were directed there. The patient verbally consented to this visit. . Location of the patient: home . Location of the provider: home . Those involved with this call:  . Provider: Aura Dials, DNP . CMA: Wilhemena Durie, CMA . Front Desk/Registration: Adela Ports  . Time spent on call: 30 minutes with patient face to face via video conference. More than 50% of this time was spent in counseling and coordination of care. 10 minutes total spent in review of patient's record and preparation of their chart.   HPI Misty Reyes presents for new patient visit to establish care.  Introduced to Publishing rights manager role and practice setting.  All questions answered.  Was receiving care with Dr. Sherie Don.  Has not health concerns at this time, at baseline does annual physical visits only with PCP.  Does see GYN, Alicia Copland PA-C, for pap exams.  Her last pap was April 2020 and was negative cytology and positive fir HPV == she is to return in one year (April 2021) for repeat.  Has underlying PCOS, no recent A1C on review of records, last glucose on labs was 88.  Has started going to gym 3 days a week and working out, started this two weeks ago.   Past Medical History:  Diagnosis Date  . Obesity (BMI 30.0-34.9)   . PCOS (polycystic ovarian syndrome)     History reviewed. No pertinent surgical history.  Family History  Problem Relation Age of Onset  . Diabetes Paternal Grandmother   . Breast cancer Neg Hx    . Ovarian cancer Neg Hx     Social History   Socioeconomic History  . Marital status: Single    Spouse name: Not on file  . Number of children: Not on file  . Years of education: 54  . Highest education level: Bachelor's degree (e.g., BA, AB, BS)  Occupational History  . Not on file  Tobacco Use  . Smoking status: Never Smoker  . Smokeless tobacco: Never Used  Substance and Sexual Activity  . Alcohol use: Never  . Drug use: Never  . Sexual activity: Not Currently    Birth control/protection: None  Other Topics Concern  . Not on file  Social History Narrative  . Not on file   Social Determinants of Health   Financial Resource Strain: Low Risk   . Difficulty of Paying Living Expenses: Not hard at all  Food Insecurity: No Food Insecurity  . Worried About Programme researcher, broadcasting/film/video in the Last Year: Never true  . Ran Out of Food in the Last Year: Never true  Transportation Needs: No Transportation Needs  . Lack of Transportation (Medical): No  . Lack of Transportation (Non-Medical): No  Physical Activity: Insufficiently Active  . Days of Exercise per Week: 3 days  . Minutes of Exercise per Session: 30 min  Stress: No Stress Concern Present  . Feeling of Stress : Not at all  Social Connections: Unknown  . Frequency  of Communication with Friends and Family: Once a week  . Frequency of Social Gatherings with Friends and Family: Once a week  . Attends Religious Services: Never  . Active Member of Clubs or Organizations: No  . Attends Banker Meetings: Never  . Marital Status: Not on file  Intimate Partner Violence: Not At Risk  . Fear of Current or Ex-Partner: No  . Emotionally Abused: No  . Physically Abused: No  . Sexually Abused: No    ROS Review of Systems  Constitutional: Negative for activity change, appetite change, diaphoresis, fatigue and fever.  Respiratory: Negative for cough, chest tightness and shortness of breath.   Cardiovascular: Negative for  chest pain, palpitations and leg swelling.  Gastrointestinal: Negative.   Endocrine: Negative.   Neurological: Negative.   Psychiatric/Behavioral: Negative.     Objective:   Today's Vitals: There were no vitals taken for this visit.  Physical Exam Vitals and nursing note reviewed.  Constitutional:      General: She is awake. She is not in acute distress.    Appearance: She is well-developed. She is not ill-appearing.  HENT:     Head: Normocephalic.     Right Ear: Hearing normal.     Left Ear: Hearing normal.  Eyes:     General: Lids are normal.        Right eye: No discharge.        Left eye: No discharge.     Conjunctiva/sclera: Conjunctivae normal.  Pulmonary:     Effort: Pulmonary effort is normal. No accessory muscle usage or respiratory distress.  Musculoskeletal:     Cervical back: Normal range of motion.  Neurological:     Mental Status: She is alert and oriented to person, place, and time.  Psychiatric:        Attention and Perception: Attention normal.        Mood and Affect: Mood normal.        Behavior: Behavior normal. Behavior is cooperative.        Thought Content: Thought content normal.        Judgment: Judgment normal.     Assessment & Plan:   Problem List Items Addressed This Visit      Endocrine   PCOS (polycystic ovarian syndrome)    Will have patient return in 3 weeks for annual physical and obtain A1C.  Continue collaboration with GYN.        Other   Preventative health care    On review is up to date on all USPSTF grade A and B recommendations.        Obesity (BMI 30.0-34.9)    Recommend continued focus on healthy diet choices and regular physical activity (30 minutes 5 days a week).        Other Visit Diagnoses    Encounter to establish care    -  Primary      Outpatient Encounter Medications as of 02/10/2020  Medication Sig  . [DISCONTINUED] medroxyPROGESTERone (PROVERA) 10 MG tablet Take 1 tablet (10 mg total) by mouth daily  for 7 days. Q90 days if no spontaneous menses   No facility-administered encounter medications on file as of 02/10/2020.   I discussed the assessment and treatment plan with the patient. The patient was provided an opportunity to ask questions and all were answered. The patient agreed with the plan and demonstrated an understanding of the instructions.   The patient was advised to call back or seek an in-person evaluation if  the symptoms worsen or if the condition fails to improve as anticipated.   I provided 30+ minutes of time during this encounter.  Follow-up: Return in about 4 weeks (around 03/09/2020) for Annual physical.   Venita Lick, NP

## 2020-02-10 NOTE — Assessment & Plan Note (Signed)
On review is up to date on all USPSTF grade A and B recommendations.

## 2020-02-10 NOTE — Assessment & Plan Note (Addendum)
Will have patient return in 3 weeks for annual physical and obtain A1C.  Continue collaboration with GYN.

## 2020-02-10 NOTE — Assessment & Plan Note (Signed)
Recommend continued focus on healthy diet choices and regular physical activity (30 minutes 5 days a week).  

## 2020-02-14 ENCOUNTER — Encounter: Payer: BLUE CROSS/BLUE SHIELD | Admitting: Family Medicine

## 2020-03-10 ENCOUNTER — Encounter: Payer: BC Managed Care – PPO | Admitting: Nurse Practitioner

## 2020-03-13 ENCOUNTER — Ambulatory Visit: Payer: Self-pay | Admitting: *Deleted

## 2020-03-13 ENCOUNTER — Encounter: Payer: BC Managed Care – PPO | Admitting: Nurse Practitioner

## 2020-03-13 NOTE — Telephone Encounter (Signed)
INFORMATION ONLY

## 2020-03-13 NOTE — Telephone Encounter (Signed)
Per initial encounter, "Pt called and stated that she has labs tomorrow and a covid vaccine and would like to know if vaccine would affect labs"; contacted pt and she states that her vaccine is the morning of 03/14/20, and labs the same afternoon; explained that COVID vaccination does not affect lab values; she verbalized understanding.  Reason for Disposition . Question about upcoming scheduled test, no triage required and triager able to answer question  Answer Assessment - Initial Assessment Questions 1. REASON FOR CALL or QUESTION: "What is your reason for calling today?" or "How can I best help you?" or "What question do you have that I can help answer?"     Does COVID vaccine affect labs?  Protocols used: INFORMATION ONLY CALL - NO TRIAGE-A-AH

## 2020-03-13 NOTE — Telephone Encounter (Signed)
Noted, thank you

## 2020-03-13 NOTE — Telephone Encounter (Signed)
Your patient 

## 2020-03-14 ENCOUNTER — Encounter: Payer: BC Managed Care – PPO | Admitting: Nurse Practitioner

## 2020-03-21 ENCOUNTER — Other Ambulatory Visit: Payer: Self-pay

## 2020-03-21 ENCOUNTER — Encounter: Payer: Self-pay | Admitting: Nurse Practitioner

## 2020-03-21 ENCOUNTER — Ambulatory Visit (INDEPENDENT_AMBULATORY_CARE_PROVIDER_SITE_OTHER): Payer: BC Managed Care – PPO | Admitting: Nurse Practitioner

## 2020-03-21 VITALS — BP 127/89 | HR 76 | Temp 97.6°F | Ht 68.0 in | Wt 235.0 lb

## 2020-03-21 DIAGNOSIS — E559 Vitamin D deficiency, unspecified: Secondary | ICD-10-CM | POA: Diagnosis not present

## 2020-03-21 DIAGNOSIS — E66811 Obesity, class 1: Secondary | ICD-10-CM

## 2020-03-21 DIAGNOSIS — E049 Nontoxic goiter, unspecified: Secondary | ICD-10-CM | POA: Diagnosis not present

## 2020-03-21 DIAGNOSIS — Z136 Encounter for screening for cardiovascular disorders: Secondary | ICD-10-CM | POA: Diagnosis not present

## 2020-03-21 DIAGNOSIS — Z0000 Encounter for general adult medical examination without abnormal findings: Secondary | ICD-10-CM

## 2020-03-21 DIAGNOSIS — E669 Obesity, unspecified: Secondary | ICD-10-CM

## 2020-03-21 DIAGNOSIS — E282 Polycystic ovarian syndrome: Secondary | ICD-10-CM | POA: Diagnosis not present

## 2020-03-21 NOTE — Progress Notes (Signed)
BP 127/89   Pulse 76   Temp 97.6 F (36.4 C) (Oral)   Ht 5\' 8"  (1.727 m)   Wt 235 lb (106.6 kg)   SpO2 100%   BMI 35.73 kg/m    Subjective:    Patient ID: , female    DOB: 1980-12-28, 39 y.o.   MRN: [id number]  HPI: Misty Reyes is a 39 y.o. female presenting on 03/21/2020 for comprehensive medical examination. Current medical complaints include:none  She currently lives with: sister and nephew Menopausal Symptoms: no   Presents today for physical exam.  Has history of PCOS, no recent A1C on review.  Is followed by GYN, saw PA Copeland in April 2020 for pap.  Pap on 03/25/2019 did note positive HPV and negative cytology, will plan to repeat this in 4 weeks.  Depression Screen done today and results listed below:  Depression screen [facility] 2/9 03/21/2020 02/10/2020 02/12/2019  Decreased Interest 0 0 0  Down, Depressed, Hopeless 1 0 0  PHQ - 2 Score 1 0 0  Altered sleeping 0 - 0  Tired, decreased energy 0 - 0  Change in appetite 0 - 0  Feeling bad or failure about yourself  0 - 0  Trouble concentrating 0 - 0  Moving slowly or fidgety/restless 0 - 0  Suicidal thoughts 0 - 0  PHQ-9 Score 1 - 0  Difficult doing work/chores Not difficult at all - Not difficult at all    The patient does not have a history of falls. I did not complete a risk assessment for falls. A plan of care for falls was not documented.   Past Medical History:  Past Medical History:  Diagnosis Date  . Obesity (BMI 30.0-34.9)   . PCOS (polycystic ovarian syndrome)     Surgical History:  History reviewed. No pertinent surgical history.  Medications:  No current outpatient medications on file prior to visit.   No current facility-administered medications on file prior to visit.    Allergies:  No Known Allergies  Social History:  Social History   Socioeconomic History  . Marital status: Single    Spouse name: Not on file  . Number of children: Not on file  . Years of education: 47  .  Highest education level: Bachelor's degree (e.g., BA, AB, BS)  Occupational History  . Not on file  Tobacco Use  . Smoking status: Never Smoker  . Smokeless tobacco: Never Used  Substance and Sexual Activity  . Alcohol use: Never  . Drug use: Never  . Sexual activity: Not Currently    Birth control/protection: None  Other Topics Concern  . Not on file  Social History Narrative  . Not on file   Social Determinants of Health   Financial Resource Strain:   . Difficulty of Paying Living Expenses:   Food Insecurity:   . Worried About 14 in the Last Year:   . Programme researcher, broadcasting/film/video in the Last Year:   Transportation Needs:   . Barista (Medical):   Freight forwarder Lack of Transportation (Non-Medical):   Physical Activity: Insufficiently Active  . Days of Exercise per Week: 3 days  . Minutes of Exercise per Session: 30 min  Stress:   . Feeling of Stress :   Social Connections:   . Frequency of Communication with Friends and Family:   . Frequency of Social Gatherings with Friends and Family:   . Attends Religious Services:   . Active Member of Clubs  or Organizations:   . Attends Banker Meetings:   Marland Kitchen Marital Status:   Intimate Partner Violence:   . Fear of Current or Ex-Partner:   . Emotionally Abused:   Marland Kitchen Physically Abused:   . Sexually Abused:    Social History   Tobacco Use  Smoking Status Never Smoker  Smokeless Tobacco Never Used   Social History   Substance and Sexual Activity  Alcohol Use Never    Family History:  Family History  Problem Relation Age of Onset  . Diabetes Paternal Grandmother   . Breast cancer Neg Hx   . Ovarian cancer Neg Hx     Past medical history, surgical history, medications, allergies, family history and social history reviewed with patient today and changes made to appropriate areas of the chart.   Review of Systems - negative All other ROS negative except what is listed above and in the HPI.        Objective:    BP 127/89   Pulse 76   Temp 97.6 F (36.4 C) (Oral)   Ht 5\' 8"  (1.727 m)   Wt 235 lb (106.6 kg)   SpO2 100%   BMI 35.73 kg/m   Wt Readings from Last 3 Encounters:  03/21/20 235 lb (106.6 kg)  03/25/19 233 lb (105.7 kg)  02/12/19 226 lb 12.8 oz (102.9 kg)    Physical Exam Constitutional:      General: She is awake. She is not in acute distress.    Appearance: She is well-developed. She is not ill-appearing.  HENT:     Head: Normocephalic and atraumatic.     Right Ear: Hearing, tympanic membrane, ear canal and external ear normal. No drainage.     Left Ear: Hearing, tympanic membrane, ear canal and external ear normal. No drainage.     Nose: Nose normal.     Right Sinus: No maxillary sinus tenderness or frontal sinus tenderness.     Left Sinus: No maxillary sinus tenderness or frontal sinus tenderness.     Mouth/Throat:     Mouth: Mucous membranes are moist.     Pharynx: Oropharynx is clear. Uvula midline. No pharyngeal swelling, oropharyngeal exudate or posterior oropharyngeal erythema.  Eyes:     General: Lids are normal.        Right eye: No discharge.        Left eye: No discharge.     Extraocular Movements: Extraocular movements intact.     Conjunctiva/sclera: Conjunctivae normal.     Pupils: Pupils are equal, round, and reactive to light.     Visual Fields: Right eye visual fields normal and left eye visual fields normal.  Neck:     Thyroid: Thyromegaly present. No thyroid mass or thyroid tenderness.     Vascular: No carotid bruit.     Trachea: Trachea normal.  Cardiovascular:     Rate and Rhythm: Normal rate and regular rhythm.     Heart sounds: Normal heart sounds. No murmur. No gallop.   Pulmonary:     Effort: Pulmonary effort is normal. No accessory muscle usage or respiratory distress.     Breath sounds: Normal breath sounds.  Chest:     Breasts:        Right: Normal.        Left: Normal.  Abdominal:     General: Bowel sounds are normal.      Palpations: Abdomen is soft. There is no hepatomegaly or splenomegaly.     Tenderness: There is no  abdominal tenderness.  Musculoskeletal:        General: Normal range of motion.     Cervical back: Normal range of motion and neck supple.     Right lower leg: No edema.     Left lower leg: No edema.  Lymphadenopathy:     Head:     Right side of head: No submental, submandibular, tonsillar, preauricular or posterior auricular adenopathy.     Left side of head: No submental, submandibular, tonsillar, preauricular or posterior auricular adenopathy.     Cervical: No cervical adenopathy.     Upper Body:     Right upper body: No supraclavicular, axillary or pectoral adenopathy.     Left upper body: No supraclavicular, axillary or pectoral adenopathy.  Skin:    General: Skin is warm and dry.     Capillary Refill: Capillary refill takes less than 2 seconds.     Findings: No rash.  Neurological:     Mental Status: She is alert and oriented to person, place, and time.     Cranial Nerves: Cranial nerves are intact.     Gait: Gait is intact.     Deep Tendon Reflexes: Reflexes are normal and symmetric.     Reflex Scores:      Brachioradialis reflexes are 2+ on the right side and 2+ on the left side.      Patellar reflexes are 2+ on the right side and 2+ on the left side. Psychiatric:        Attention and Perception: Attention normal.        Mood and Affect: Mood normal.        Speech: Speech normal.        Behavior: Behavior normal. Behavior is cooperative.        Thought Content: Thought content normal.        Judgment: Judgment normal.     Results for orders placed or performed in visit on 03/25/19  Cytology - PAP  Result Value Ref Range   Adequacy      Satisfactory for evaluation  endocervical/transformation zone component PRESENT.   Diagnosis      NEGATIVE FOR INTRAEPITHELIAL LESIONS OR MALIGNANCY.   HPV DETECTED (A)    Material Submitted CervicoVaginal Pap [ThinPrep Imaged]     CYTOLOGY - PAP PAP RESULT       Assessment & Plan:   Problem List Items Addressed This Visit      Endocrine   PCOS (polycystic ovarian syndrome)    Ongoing x 10 years, no recent A1C on chart.  Will check this today and discussed with patient.  Continue collaboration with GYN.  Initiate diabetes medication as needed dependent on labs.  Education on PCOS diet provided.      Relevant Orders   HgB A1c   Enlarged thyroid    Noted on exam today and patient reports she was told it was enlarged 10 years ago.  Check TSH today and obtain thyroid ultrasound.  Orders placed and discussed with patient.      Relevant Orders   TSH   US THYROID     Other   Preventative health care    Labs today, plan on pap repeat in 4 weeks as was performed 03/25/2019 and positive HPV, needs one year repeat.      Obesity (BMI 30.0-34.9)    Recommended eating smaller high protein, low fat meals more frequently and exercising 30 mins a day 5 times a week with a goal of 10-15lb weight  loss in the next 3 months. Patient voiced their understanding and motivation to adhere to these recommendations.        Other Visit Diagnoses    Encounter for physical examination    -  Primary   Annual labs obtained to include CBC, CMP, TSH, lipid   Relevant Orders   CBC with Differential/Platelet   Comprehensive metabolic panel   Lipid Panel w/o Chol/HDL Ratio   Vitamin D deficiency       Reports history of low levels, check level today and start supplement as needed.   Relevant Orders   VITAMIN D 25 Hydroxy (Vit-D Deficiency, Fractures)       Follow up plan: Return in about 4 weeks (around 04/18/2020) for Pap only visit due to past HPV positive one year ago.   LABORATORY TESTING:  - Pap smear: repeat in 4 weeks due to positive HPV last year  IMMUNIZATIONS:   - Tdap: Tetanus vaccination status reviewed: last tetanus booster within 10 years. - Influenza: Up to date - Pneumovax: Not applicable - Prevnar: Not  applicable - HPV: Not applicable - Zostavax vaccine: Not applicable  SCREENING: -Mammogram: Not applicable  - Colonoscopy: Not applicable  - Bone Density: Not applicable  -Hearing Test: Not applicable  -Spirometry: Not applicable   PATIENT COUNSELING:   Advised to take 1 mg of folate supplement per day if capable of pregnancy.   Sexuality: Discussed sexually transmitted diseases, partner selection, use of condoms, avoidance of unintended pregnancy  and contraceptive alternatives.   Advised to avoid cigarette smoking.  I discussed with the patient that most people either abstain from alcohol or drink within safe limits (<=14/week and <=4 drinks/occasion for males, <=7/weeks and <= 3 drinks/occasion for females) and that the risk for alcohol disorders and other health effects rises proportionally with the number of drinks per week and how often a drinker exceeds daily limits.  Discussed cessation/primary prevention of drug use and availability of treatment for abuse.   Diet: Encouraged to adjust caloric intake to maintain  or achieve ideal body weight, to reduce intake of dietary saturated fat and total fat, to limit sodium intake by avoiding high sodium foods and not adding table salt, and to maintain adequate dietary potassium and calcium preferably from fresh fruits, vegetables, and low-fat dairy products.    stressed the importance of regular exercise  Injury prevention: Discussed safety belts, safety helmets, smoke detector, smoking near bedding or upholstery.   Dental health: Discussed importance of regular tooth brushing, flossing, and dental visits.    NEXT PREVENTATIVE PHYSICAL DUE IN 1 YEAR. Return in about 4 weeks (around 04/18/2020) for Pap only visit due to past HPV positive one year ago.

## 2020-03-21 NOTE — Assessment & Plan Note (Signed)
Recommended eating smaller high protein, low fat meals more frequently and exercising 30 mins a day 5 times a week with a goal of 10-15lb weight loss in the next 3 months. Patient voiced their understanding and motivation to adhere to these recommendations.  

## 2020-03-21 NOTE — Assessment & Plan Note (Signed)
Noted on exam today and patient reports she was told it was enlarged 10 years ago.  Check TSH today and obtain thyroid ultrasound.  Orders placed and discussed with patient.

## 2020-03-21 NOTE — Patient Instructions (Addendum)
Diet for Polycystic Ovary Syndrome Polycystic ovary syndrome (PCOS) is a disorder of the chemicals (hormones) that regulate a woman's reproductive system, including monthly periods (menstruation). The condition causes important hormones to be out of balance. PCOS can:  Stop your periods or make them irregular.  Cause cysts to develop on your ovaries.  Make it difficult to get pregnant.  Stop your body from responding to the effects of insulin (insulin resistance). Insulin resistance can lead to obesity and diabetes. Changing what you eat can help you manage PCOS and improve your health. Following a balanced diet can help you lose weight and improve the way that your body uses insulin. What are tips for following this plan?  Follow a balanced diet for meals and snacks. Eat breakfast, lunch, dinner, and one or two snacks every day.  Include protein in each meal and snack.  Choose whole grains instead of products that are made with refined flour.  Eat a variety of foods.  Exercise regularly as told by your health care provider. Aim to do 30 or more minutes of exercise on most days of the week.  If you are overweight or obese: ? Pay attention to how many calories you eat. Cutting down on calories can help you lose weight. ? Work with your health care provider or a diet and nutrition specialist (dietitian) to figure out how many calories you need each day. What foods can I eat?  Fruits Include a variety of colors and types. All fruits are helpful for PCOS. Vegetables Include a variety of colors and types. All vegetables are helpful for PCOS. Grains Whole grains, such as whole wheat. Whole-grain breads, crackers, cereals, and pasta. Unsweetened oatmeal, bulgur, barley, quinoa, and brown rice. Tortillas made from corn or whole-wheat flour. Meats and other proteins Low-fat (lean) proteins, such as fish, chicken, beans, eggs, and tofu. Dairy Low-fat dairy products, such as skim milk,  cheese sticks, and yogurt. Beverages Low-fat or fat-free drinks, such as water, low-fat milk, sugar-free drinks, and small amounts of 100% fruit juice. Seasonings and condiments Ketchup. Mustard. Barbecue sauce. Relish. Low-fat or fat-free mayonnaise. Fats and oils Olive oil or canola oil. Walnuts and almonds. The items listed above may not be a complete list of recommended foods and beverages. Contact a dietitian for more options. What foods are not recommended? Foods that are high in calories or fat. Fried foods. Sweets. Products that are made from refined white flour, including white bread, pastries, white rice, and pasta. The items listed above may not be a complete list of foods and beverages to avoid. Contact a dietitian for more information. Summary  PCOS is a hormonal imbalance that affects a woman's reproductive system.  You can help to manage your PCOS by exercising regularly and eating a healthy, varied diet of vegetables, fruit, whole grains, low-fat (lean) protein, and low-fat dairy products.  Changing what you eat can improve the way that your body uses insulin, help your hormones reach normal levels, and help you lose weight. This information is not intended to replace advice given to you by your health care provider. Make sure you discuss any questions you have with your health care provider. Document Revised: 03/17/2019 Document Reviewed: 09/29/2017 Elsevier Patient Education  2020 [employer].   Health Maintenance, Female Adopting a healthy lifestyle and getting preventive care are important in promoting health and wellness. Ask your health care provider about:  The right schedule for you to have regular tests and exams.  Things you can do  on your own to prevent diseases and keep yourself healthy. What should I know about diet, weight, and exercise? Eat a healthy diet   Eat a diet that includes plenty of vegetables, fruits, low-fat dairy products, and lean  protein.  Do not eat a lot of foods that are high in solid fats, added sugars, or sodium. Maintain a healthy weight Body mass index (BMI) is used to identify weight problems. It estimates body fat based on height and weight. Your health care provider can help determine your BMI and help you achieve or maintain a healthy weight. Get regular exercise Get regular exercise. This is one of the most important things you can do for your health. Most adults should:  Exercise for at least 150 minutes each week. The exercise should increase your heart rate and make you sweat (moderate-intensity exercise).  Do strengthening exercises at least twice a week. This is in addition to the moderate-intensity exercise.  Spend less time sitting. Even light physical activity can be beneficial. Watch cholesterol and blood lipids Have your blood tested for lipids and cholesterol at 39 years of age, then have this test every 5 years. Have your cholesterol levels checked more often if:  Your lipid or cholesterol levels are high.  You are older than 39 years of age.  You are at high risk for heart disease. What should I know about cancer screening? Depending on your health history and family history, you may need to have cancer screening at various ages. This may include screening for:  Breast cancer.  Cervical cancer.  Colorectal cancer.  Skin cancer.  Lung cancer. What should I know about heart disease, diabetes, and high blood pressure? Blood pressure and heart disease  High blood pressure causes heart disease and increases the risk of stroke. This is more likely to develop in people who have high blood pressure readings, are of African descent, or are overweight.  Have your blood pressure checked: ? Every 3-5 years if you are 89-18 years of age. ? Every year if you are 79 years old or older. Diabetes Have regular diabetes screenings. This checks your fasting blood sugar level. Have the screening  done:  Once every three years after age 70 if you are at a normal weight and have a low risk for diabetes.  More often and at a younger age if you are overweight or have a high risk for diabetes. What should I know about preventing infection? Hepatitis B If you have a higher risk for hepatitis B, you should be screened for this virus. Talk with your health care provider to find out if you are at risk for hepatitis B infection. Hepatitis C Testing is recommended for:  Everyone born from [birth year] through 1965.  Anyone with known risk factors for hepatitis C. Sexually transmitted infections (STIs)  Get screened for STIs, including gonorrhea and chlamydia, if: ? You are sexually active and are younger than 39 years of age. ? You are older than 39 years of age and your health care provider tells you that you are at risk for this type of infection. ? Your sexual activity has changed since you were last screened, and you are at increased risk for chlamydia or gonorrhea. Ask your health care provider if you are at risk.  Ask your health care provider about whether you are at high risk for HIV. Your health care provider may recommend a prescription medicine to help prevent HIV infection. If you choose to take medicine to  prevent HIV, you should first get tested for HIV. You should then be tested every 3 months for as long as you are taking the medicine. Pregnancy  If you are about to stop having your period (premenopausal) and you may become pregnant, seek counseling before you get pregnant.  Take 400 to 800 micrograms (mcg) of folic acid every day if you become pregnant.  Ask for birth control (contraception) if you want to prevent pregnancy. Osteoporosis and menopause Osteoporosis is a disease in which the bones lose minerals and strength with aging. This can result in bone fractures. If you are 38 years old or older, or if you are at risk for osteoporosis and fractures, ask your health care  provider if you should:  Be screened for bone loss.  Take a calcium or vitamin D supplement to lower your risk of fractures.  Be given hormone replacement therapy (HRT) to treat symptoms of menopause. Follow these instructions at home: Lifestyle  Do not use any products that contain nicotine or tobacco, such as cigarettes, e-cigarettes, and chewing tobacco. If you need help quitting, ask your health care provider.  Do not use street drugs.  Do not share needles.  Ask your health care provider for help if you need support or information about quitting drugs. Alcohol use  Do not drink alcohol if: ? Your health care provider tells you not to drink. ? You are pregnant, may be pregnant, or are planning to become pregnant.  If you drink alcohol: ? Limit how much you use to 0-1 drink a day. ? Limit intake if you are breastfeeding.  Be aware of how much alcohol is in your drink. In the [place] one drink equals one 12 oz bottle of beer (355 mL), one 5 oz glass of wine (148 mL), or one 1 oz glass of hard liquor (44 mL). General instructions  Schedule regular health, dental, and eye exams.  Stay current with your vaccines.  Tell your health care provider if: ? You often feel depressed. ? You have ever been abused or do not feel safe at home. Summary  Adopting a healthy lifestyle and getting preventive care are important in promoting health and wellness.  Follow your health care provider's instructions about healthy diet, exercising, and getting tested or screened for diseases.  Follow your health care provider's instructions on monitoring your cholesterol and blood pressure. This information is not intended to replace advice given to you by your health care provider. Make sure you discuss any questions you have with your health care provider. Document Revised: 11/18/2018 Document Reviewed: 11/18/2018 Elsevier Patient Education  2020 [facility])  Exercise Recommendation  Being physically active is important to prevent heart disease and stroke, the nation's No. 1and No. 5killers. To improve overall cardiovascular health, we suggest at least 150 minutes per week of moderate exercise or 75 minutes per week of vigorous exercise (or a combination of moderate and vigorous activity). Thirty minutes a day, five times a week is an easy goal to remember. You will also experience benefits even if you divide your time into two or three segments of 10 to 15 minutes per day.  For people who would benefit from lowering their blood pressure or cholesterol, we recommend 40 minutes of aerobic exercise of moderate to vigorous intensity three to four times a week to lower the risk for heart attack and stroke.  Physical activity is anything that makes you move your body and burn calories.  This includes things like climbing stairs or playing sports. Aerobic exercises benefit your heart, and include walking, jogging, swimming or biking. Strength and stretching exercises are best for overall stamina and flexibility.  The simplest, positive change you can make to effectively improve your heart health is to start walking. It's enjoyable, free, easy, social and great exercise. A walking program is flexible and boasts high success rates because people can stick with it. It's easy for walking to become a regular and satisfying part of life.   For Overall Cardiovascular Health:  At least 30 minutes of moderate-intensity aerobic activity at least 5 days per week for a total of 150  OR   At least 25 minutes of vigorous aerobic activity at least 3 days per week for a total of 75 minutes; or a combination of moderate- and vigorous-intensity aerobic activity  AND   Moderate- to high-intensity muscle-strengthening activity at least 2 days per week for additional health benefits.  For Lowering Blood Pressure and Cholesterol  An average 40 minutes of moderate- to  vigorous-intensity aerobic activity 3 or 4 times per week  What if I can't make it to the time goal? Something is always better than nothing! And everyone has to start somewhere. Even if you've been sedentary for years, today is the day you can begin to make healthy changes in your life. If you don't think you'll make it for 30 or 40 minutes, set a reachable goal for today. You can work up toward your overall goal by increasing your time as you get stronger. Don't let all-or-nothing thinking rob you of doing what you can every day.  Source:http://www.heart.org

## 2020-03-21 NOTE — Assessment & Plan Note (Signed)
Labs today, plan on pap repeat in 4 weeks as was performed 03/25/2019 and positive HPV, needs one year repeat.

## 2020-03-21 NOTE — Assessment & Plan Note (Signed)
Ongoing x 10 years, no recent A1C on chart.  Will check this today and discussed with patient.  Continue collaboration with GYN.  Initiate diabetes medication as needed dependent on labs.  Education on PCOS diet provided.

## 2020-03-22 LAB — CBC WITH DIFFERENTIAL/PLATELET
Basophils Absolute: 0 10*3/uL (ref 0.0–0.2)
Basos: 1 %
EOS (ABSOLUTE): 0.2 10*3/uL (ref 0.0–0.4)
Eos: 4 %
Hematocrit: 38.4 % (ref 34.0–46.6)
Hemoglobin: 12.1 g/dL (ref 11.1–15.9)
Immature Grans (Abs): 0 10*3/uL (ref 0.0–0.1)
Immature Granulocytes: 1 %
Lymphocytes Absolute: 1.5 10*3/uL (ref 0.7–3.1)
Lymphs: 37 %
MCH: 27.1 pg (ref 26.6–33.0)
MCHC: 31.5 g/dL (ref 31.5–35.7)
MCV: 86 fL (ref 79–97)
Monocytes Absolute: 0.3 10*3/uL (ref 0.1–0.9)
Monocytes: 7 %
Neutrophils Absolute: 2.2 10*3/uL (ref 1.4–7.0)
Neutrophils: 50 %
Platelets: 165 10*3/uL (ref 150–450)
RBC: 4.46 x10E6/uL (ref 3.77–5.28)
RDW: 13.3 % (ref 11.7–15.4)
WBC: 4.2 10*3/uL (ref 3.4–10.8)

## 2020-03-22 LAB — COMPREHENSIVE METABOLIC PANEL
ALT: 12 IU/L (ref 0–32)
AST: 14 IU/L (ref 0–40)
Albumin/Globulin Ratio: 1.5 (ref 1.2–2.2)
Albumin: 4.2 g/dL (ref 3.8–4.8)
Alkaline Phosphatase: 61 IU/L (ref 39–117)
BUN/Creatinine Ratio: 9 (ref 9–23)
BUN: 6 mg/dL (ref 6–20)
Bilirubin Total: 0.8 mg/dL (ref 0.0–1.2)
CO2: 22 mmol/L (ref 20–29)
Calcium: 8.6 mg/dL — ABNORMAL LOW (ref 8.7–10.2)
Chloride: 103 mmol/L (ref 96–106)
Creatinine, Ser: 0.64 mg/dL (ref 0.57–1.00)
GFR calc Af Amer: 131 mL/min/{1.73_m2} (ref >59)
GFR calc non Af Amer: 114 mL/min/{1.73_m2} (ref >59)
Globulin, Total: 2.8 g/dL (ref 1.5–4.5)
Glucose: 99 mg/dL (ref 65–99)
Potassium: 4 mmol/L (ref 3.5–5.2)
Sodium: 139 mmol/L (ref 134–144)
Total Protein: 7 g/dL (ref 6.0–8.5)

## 2020-03-22 LAB — LIPID PANEL W/O CHOL/HDL RATIO
Cholesterol, Total: 170 mg/dL (ref 100–199)
HDL: 50 mg/dL (ref >39)
LDL Chol Calc (NIH): 107 mg/dL — ABNORMAL HIGH (ref 0–99)
Triglycerides: 68 mg/dL (ref 0–149)
VLDL Cholesterol Cal: 13 mg/dL (ref 5–40)

## 2020-03-22 LAB — HEMOGLOBIN A1C
Est. average glucose Bld gHb Est-mCnc: 117 mg/dL
Hgb A1c MFr Bld: 5.7 % — ABNORMAL HIGH (ref 4.8–5.6)

## 2020-03-22 LAB — VITAMIN D 25 HYDROXY (VIT D DEFICIENCY, FRACTURES): Vit D, 25-Hydroxy: 18.8 ng/mL — ABNORMAL LOW (ref 30.0–100.0)

## 2020-03-22 LAB — TSH: TSH: 1.85 u[IU]/mL (ref 0.450–4.500)

## 2020-03-22 NOTE — Progress Notes (Signed)
Contacted via MyChart Good evening Liviana.  Your labs have returned.   - CBC is normal, no anemia - A1C, diabetes testing, does show some prediabetes.  This being numbers between 5.7 and 6.4.  Anything 6.5 or greater is diabetes.  I recommend focus on diet changes, less sugar and carbohydrates + regular exercise. Will continue to monitor this every 6 months. - Kidney and liver function look good. - Thyroid blood work is normal - Vitamin D is a little low, recommend you take Vitamin D3 1000 units daily, which you can obtain in vitamin section. - Your LDL is above normal. The LDL is the bad cholesterol. Over time and in combination with inflammation and other factors, this contributes to plaque which in turn may lead to stroke and/or heart attack down the road. Sometimes high LDL is primarily genetic, and people might be eating all the right foods but still have high numbers. Other times, there is room for improvement in one's diet and eating healthier can bring this number down and potentially reduce one's risk of heart attack and/or stroke.   To reduce your LDL, Remember - more fruits and vegetables, more fish, and limit red meat and dairy products. More soy, nuts, beans, barley, lentils, oats and plant sterol ester enriched margarine instead of butter. I also encourage eliminating sugar and processed food. Remember, shop on the outside of the grocery store and visit your [employer]. If you would like to talk with me about dietary changes for your cholesterol, please let me know. We should recheck your cholesterol in 12 months.

## 2020-03-24 ENCOUNTER — Ambulatory Visit: Payer: BLUE CROSS/BLUE SHIELD

## 2020-03-29 ENCOUNTER — Other Ambulatory Visit: Payer: Self-pay

## 2020-03-29 ENCOUNTER — Ambulatory Visit
Admission: RE | Admit: 2020-03-29 | Discharge: 2020-03-29 | Disposition: A | Discharge status: Home / Self Care | Payer: BC Managed Care – PPO | Source: Ambulatory Visit | Attending: Nurse Practitioner | Admitting: Nurse Practitioner

## 2020-03-29 DIAGNOSIS — E049 Nontoxic goiter, unspecified: Secondary | ICD-10-CM | POA: Diagnosis not present

## 2020-03-29 DIAGNOSIS — E010 Iodine-deficiency related diffuse (endemic) goiter: Secondary | ICD-10-CM | POA: Diagnosis not present

## 2020-03-30 NOTE — Progress Notes (Signed)
Contacted via MyChart Good morning Misty Reyes.  Your thyroid ultrasound has returned.  It does show a mildly enlarged thyroid with no nodule or masses.  For this you do not require treatment at this time.  We will continue to monitor you for symptoms such as fatigue, weight changes, constipation, diarrhea, mood changes.  Plan to check thyroid blood work yearly, unless you have symptoms then would check sooner.  Would benefit from repeating this ultrasound in 2-5 years to reassess.  Any questions? Let me know.  Have a great day. Kindest regards,  Lakisa Lotz

## 2020-04-25 ENCOUNTER — Encounter: Payer: Self-pay | Admitting: Nurse Practitioner

## 2020-04-25 ENCOUNTER — Other Ambulatory Visit (HOSPITAL_COMMUNITY)
Admission: RE | Admit: 2020-04-25 | Discharge: 2020-04-25 | Disposition: A | Discharge status: Home / Self Care | Payer: BC Managed Care – PPO | Source: Ambulatory Visit | Attending: Nurse Practitioner | Admitting: Nurse Practitioner

## 2020-04-25 ENCOUNTER — Ambulatory Visit (INDEPENDENT_AMBULATORY_CARE_PROVIDER_SITE_OTHER): Payer: BC Managed Care – PPO | Admitting: Nurse Practitioner

## 2020-04-25 ENCOUNTER — Other Ambulatory Visit: Payer: Self-pay

## 2020-04-25 VITALS — BP 133/92 | HR 80 | Temp 98.5°F | Wt 236.0 lb

## 2020-04-25 DIAGNOSIS — E669 Obesity, unspecified: Secondary | ICD-10-CM

## 2020-04-25 DIAGNOSIS — R87810 Cervical high risk human papillomavirus (HPV) DNA test positive: Secondary | ICD-10-CM | POA: Diagnosis not present

## 2020-04-25 NOTE — Progress Notes (Signed)
BP (!) 133/92   Pulse 80   Temp 98.5 F (36.9 C) (Oral)   Wt 236 lb (107 kg)   SpO2 97%   BMI 35.88 kg/m    Subjective:    Patient ID: Misty Reyes, female    DOB: 08/19/1981, 39 y.o.   MRN: [id number]  HPI: Misty Reyes is a 39 y.o. female  Chief Complaint  Patient presents with  . Papsmear   PAP PROCEDURE: History of HPV noted on pap one year ago with normal cytology.  Denies abnormal bleeding.  Has not had sexual intercourse in some time.  No history of STDs.  Relevant past medical, surgical, family and social history reviewed and updated as indicated. Interim medical history since our last visit reviewed. Allergies and medications reviewed and updated.  Review of Systems  Constitutional: Negative for activity change, appetite change, diaphoresis, fatigue and fever.  Respiratory: Negative for cough, chest tightness and shortness of breath.   Cardiovascular: Negative for chest pain, palpitations and leg swelling.  Gastrointestinal: Negative.   Neurological: Negative.   Psychiatric/Behavioral: Negative.     Per HPI unless specifically indicated above     Objective:    BP (!) 133/92   Pulse 80   Temp 98.5 F (36.9 C) (Oral)   Wt 236 lb (107 kg)   SpO2 97%   BMI 35.88 kg/m   Wt Readings from Last 3 Encounters:  04/25/20 236 lb (107 kg)  03/21/20 235 lb (106.6 kg)  03/25/19 233 lb (105.7 kg)    Physical Exam Vitals and nursing note reviewed.  Constitutional:      General: She is awake. She is not in acute distress.    Appearance: She is well-developed and well-groomed. She is obese. She is not ill-appearing.  HENT:     Head: Normocephalic.     Right Ear: Hearing normal.     Left Ear: Hearing normal.  Eyes:     General: Lids are normal.        Right eye: No discharge.        Left eye: No discharge.     Conjunctiva/sclera: Conjunctivae normal.     Pupils: Pupils are equal, round, and reactive to light.  Cardiovascular:     Rate and Rhythm: Normal  rate and regular rhythm.     Heart sounds: Normal heart sounds. No murmur. No gallop.   Pulmonary:     Effort: Pulmonary effort is normal. No accessory muscle usage or respiratory distress.     Breath sounds: Normal breath sounds.  Abdominal:     General: Bowel sounds are normal.     Palpations: Abdomen is soft.  Genitourinary:    Exam position: Lithotomy position.     Cervix: Normal.     Uterus: Normal.      Adnexa: Right adnexa normal and left adnexa normal.     Comments: Mild erythema to vaginal walls, cervix anterior and pap obtained. Musculoskeletal:     Cervical back: Normal range of motion and neck supple.     Right lower leg: No edema.     Left lower leg: No edema.  Skin:    General: Skin is warm and dry.  Neurological:     Mental Status: She is alert and oriented to person, place, and time.  Psychiatric:        Attention and Perception: Attention normal.        Mood and Affect: Mood normal.        Behavior: Behavior normal.  Behavior is cooperative.        Thought Content: Thought content normal.        Judgment: Judgment normal.     Results for orders placed or performed in visit on 03/21/20  HgB A1c  Result Value Ref Range   Hgb A1c MFr Bld 5.7 (H) 4.8 - 5.6 %   Est. average glucose Bld gHb Est-mCnc 117 mg/dL  CBC with Differential/Platelet  Result Value Ref Range   WBC 4.2 3.4 - 10.8 x10E3/uL   RBC 4.46 3.77 - 5.28 x10E6/uL   Hemoglobin 12.1 11.1 - 15.9 g/dL   Hematocrit 17.5 10.2 - 46.6 %   MCV 86 79 - 97 fL   MCH 27.1 26.6 - 33.0 pg   MCHC 31.5 31.5 - 35.7 g/dL   RDW 58.5 27.7 - 82.4 %   Platelets 165 150 - 450 x10E3/uL   Neutrophils 50 Not Estab. %   Lymphs 37 Not Estab. %   Monocytes 7 Not Estab. %   Eos 4 Not Estab. %   Basos 1 Not Estab. %   Neutrophils Absolute 2.2 1.4 - 7.0 x10E3/uL   Lymphocytes Absolute 1.5 0.7 - 3.1 x10E3/uL   Monocytes Absolute 0.3 0.1 - 0.9 x10E3/uL   EOS (ABSOLUTE) 0.2 0.0 - 0.4 x10E3/uL   Basophils Absolute 0.0 0.0 -  0.2 x10E3/uL   Immature Granulocytes 1 Not Estab. %   Immature Grans (Abs) 0.0 0.0 - 0.1 x10E3/uL  Comprehensive metabolic panel  Result Value Ref Range   Glucose 99 65 - 99 mg/dL   BUN 6 6 - 20 mg/dL   Creatinine, Ser 2.35 0.57 - 1.00 mg/dL   GFR calc non Af Amer 114 >59 mL/min/1.73   GFR calc Af Amer 131 >59 mL/min/1.73   BUN/Creatinine Ratio 9 9 - 23   Sodium 139 134 - 144 mmol/L   Potassium 4.0 3.5 - 5.2 mmol/L   Chloride 103 96 - 106 mmol/L   CO2 22 20 - 29 mmol/L   Calcium 8.6 (L) 8.7 - 10.2 mg/dL   Total Protein 7.0 6.0 - 8.5 g/dL   Albumin 4.2 3.8 - 4.8 g/dL   Globulin, Total 2.8 1.5 - 4.5 g/dL   Albumin/Globulin Ratio 1.5 1.2 - 2.2   Bilirubin Total 0.8 0.0 - 1.2 mg/dL   Alkaline Phosphatase 61 39 - 117 IU/L   AST 14 0 - 40 IU/L   ALT 12 0 - 32 IU/L  Lipid Panel w/o Chol/HDL Ratio  Result Value Ref Range   Cholesterol, Total 170 100 - 199 mg/dL   Triglycerides 68 0 - 149 mg/dL   HDL 50 >36 mg/dL   VLDL Cholesterol Cal 13 5 - 40 mg/dL   LDL Chol Calc (NIH) 144 (H) 0 - 99 mg/dL  TSH  Result Value Ref Range   TSH 1.850 0.450 - 4.500 uIU/mL  VITAMIN D 25 Hydroxy (Vit-D Deficiency, Fractures)  Result Value Ref Range   Vit D, 25-Hydroxy 18.8 (L) 30.0 - 100.0 ng/mL      Assessment & Plan:   Problem List Items Addressed This Visit      Other   Obesity (BMI 30.0-34.9)    Recommended eating smaller high protein, low fat meals more frequently and exercising 30 mins a day 5 times a week with a goal of 10-15lb weight loss in the next 3 months. Patient voiced their understanding and motivation to adhere to these recommendations.       Cervical high risk HPV (human papillomavirus) test  positive - Primary    Repeat pap today and review with patient once resulted.      Relevant Orders   Cytology - PAP       Follow up plan: Return in about 6 months (around 10/26/2020) for Prediabetes and Vit D.

## 2020-04-25 NOTE — Assessment & Plan Note (Signed)
Repeat pap today and review with patient once resulted.

## 2020-04-25 NOTE — Patient Instructions (Signed)

## 2020-04-25 NOTE — Assessment & Plan Note (Signed)
Recommended eating smaller high protein, low fat meals more frequently and exercising 30 mins a day 5 times a week with a goal of 10-15lb weight loss in the next 3 months. Patient voiced their understanding and motivation to adhere to these recommendations.  

## 2020-04-26 LAB — CYTOLOGY - PAP
Adequacy: ABSENT
Comment: NEGATIVE
Diagnosis: NEGATIVE
High risk HPV: POSITIVE — AB

## 2020-04-26 NOTE — Progress Notes (Signed)
Contacted via MyChartGood evening Misty Reyes, your pap has returned and HPV did return positive again.  This means we do need to place a referral to gynecology and have further testing performed, called a colposcopy.  I am sorry.:( Please let me know you have received this message and then I will placed referral to GYN, [facility].  Thank you. Keep being awesome!! Kindest regards, Zameria Vogl

## 2020-04-27 ENCOUNTER — Other Ambulatory Visit: Payer: Self-pay | Admitting: Nurse Practitioner

## 2020-04-27 DIAGNOSIS — R87810 Cervical high risk human papillomavirus (HPV) DNA test positive: Secondary | ICD-10-CM

## 2020-04-27 NOTE — Progress Notes (Signed)
HPV + x 2 years in a row, GYN referral for further evaluation.  Discussed with patient.

## 2020-05-16 ENCOUNTER — Ambulatory Visit (INDEPENDENT_AMBULATORY_CARE_PROVIDER_SITE_OTHER): Payer: BC Managed Care – PPO | Admitting: Obstetrics and Gynecology

## 2020-05-16 ENCOUNTER — Other Ambulatory Visit (HOSPITAL_COMMUNITY)
Admission: RE | Admit: 2020-05-16 | Discharge: 2020-05-16 | Disposition: A | Discharge status: Home / Self Care | Payer: BC Managed Care – PPO | Source: Ambulatory Visit | Attending: Obstetrics and Gynecology | Admitting: Obstetrics and Gynecology

## 2020-05-16 ENCOUNTER — Encounter: Payer: Self-pay | Admitting: Obstetrics and Gynecology

## 2020-05-16 ENCOUNTER — Other Ambulatory Visit: Payer: Self-pay

## 2020-05-16 VITALS — BP 130/76 | Ht 69.0 in | Wt 234.4 lb

## 2020-05-16 DIAGNOSIS — R87810 Cervical high risk human papillomavirus (HPV) DNA test positive: Secondary | ICD-10-CM

## 2020-05-16 DIAGNOSIS — N72 Inflammatory disease of cervix uteri: Secondary | ICD-10-CM | POA: Diagnosis not present

## 2020-05-16 NOTE — Progress Notes (Signed)
   GYNECOLOGY CLINIC COLPOSCOPY PROCEDURE NOTE  39 y.o. [id number] here for colposcopy for NIL and HR HPV+  pap smear on 04/25/2020. Discussed underlying role for HPV infection in the development of cervical dysplasia, its natural history and progression/regression, need for surveillance.  Is the patient  pregnant: No LMP: Patient's last menstrual period was 05/10/2020. Smoking status:  reports that she has never smoked. She has never used smokeless tobacco. Contraception: abstinence Future fertility desired: yes  Patient given informed consent, signed copy in the chart, time out was performed.  The patient was position in dorsal lithotomy position. Speculum was placed the cervix was visualized.   After application of acetic acid colposcopic inspection of the cervix was undertaken.   Colposcopy adequate, full visualization of transformation zone: Yes acetowhite lesion(s) noted at 7 o'clock; corresponding biopsies obtained.   ECC specimen obtained:  Yes  All specimens were labeled and sent to pathology.   Patient was given post procedure instructions.  Will follow up pathology and manage accordingly.  Routine preventative health maintenance measures emphasized.  Physical Exam Genitourinary:        Adelene Idler MD [facility], [facility] 05/16/2020 1:23 PM

## 2020-05-16 NOTE — Patient Instructions (Signed)
Colposcopy, Care After This sheet gives you information about how to care for yourself after your procedure. Your doctor may also give you more specific instructions. If you have problems or questions, contact your doctor. What can I expect after the procedure? If you did not have a tissue sample removed (did not have a biopsy), you may only have some spotting for a few days. You can go back to your normal activities. If you had a tissue sample removed, it is common to have:  Soreness and pain. This may last for a few days.  Light-headedness.  Mild bleeding from your vagina or dark-colored, grainy discharge from your vagina. This may last for a few days. You may need to wear a sanitary pad.  Spotting for at least 48 hours after the procedure. Follow these instructions at home:   Take over-the-counter and prescription medicines only as told by your doctor. Ask your doctor what medicines you can start taking again. This is very important if you take blood-thinning medicine.  Do not drive or use heavy machinery while taking prescription pain medicine.  For 3 days, or as long as your doctor tells you, avoid: ? Douching. ? Using tampons. ? Having sex.  If you use birth control (contraception), keep using it.  Limit activity for the first day after the procedure. Ask your doctor what activities are safe for you.  It is up to you to get the results of your procedure. Ask your doctor when your results will be ready.  Keep all follow-up visits as told by your doctor. This is important. Contact a doctor if:  You get a skin rash. Get help right away if:  You are bleeding a lot from your vagina. It is a lot of bleeding if you are using more than one pad an hour for 2 hours in a row.  You have clumps of blood (blood clots) coming from your vagina.  You have a fever.  You have chills  You have pain in your lower belly (pelvic area).  You have signs of infection, such as vaginal  discharge that is: ? Different than usual. ? Yellow. ? Bad-smelling.  You have very pain or cramps in your lower belly that do not get better with medicine.  You feel light-headed.  You feel dizzy.  You pass out (faint). Summary  If you did not have a tissue sample removed (did not have a biopsy), you may only have some spotting for a few days. You can go back to your normal activities.  If you had a tissue sample removed, it is common to have mild pain and spotting for 48 hours.  For 3 days, or as long as your doctor tells you, avoid douching, using tampons and having sex.  Get help right away if you have bleeding, very bad pain, or signs of infection. This information is not intended to replace advice given to you by your health care provider. Make sure you discuss any questions you have with your health care provider. Document Revised: 11/07/2017 Document Reviewed: 08/14/2016 Elsevier Patient Education  2020 Elsevier Inc.  

## 2020-05-18 LAB — SURGICAL PATHOLOGY

## 2020-05-19 ENCOUNTER — Telehealth: Payer: Self-pay | Admitting: Obstetrics and Gynecology

## 2020-05-19 NOTE — Telephone Encounter (Signed)
Patient is calling for labs results. Please advise. Patient is currently at work and ok'd to leave a detailed message on her voicemail

## 2020-05-19 NOTE — Telephone Encounter (Signed)
Returned call and left detailed message as requested

## 2020-10-22 ENCOUNTER — Encounter: Payer: Self-pay | Admitting: Nurse Practitioner

## 2020-10-22 DIAGNOSIS — E559 Vitamin D deficiency, unspecified: Secondary | ICD-10-CM | POA: Insufficient documentation

## 2020-10-22 DIAGNOSIS — R7309 Other abnormal glucose: Secondary | ICD-10-CM | POA: Insufficient documentation

## 2020-10-22 DIAGNOSIS — E7800 Pure hypercholesterolemia, unspecified: Secondary | ICD-10-CM | POA: Insufficient documentation

## 2020-10-25 ENCOUNTER — Ambulatory Visit (INDEPENDENT_AMBULATORY_CARE_PROVIDER_SITE_OTHER): Payer: BC Managed Care – PPO | Admitting: Nurse Practitioner

## 2020-10-25 ENCOUNTER — Encounter: Payer: Self-pay | Admitting: Nurse Practitioner

## 2020-10-25 ENCOUNTER — Other Ambulatory Visit: Payer: Self-pay

## 2020-10-25 VITALS — BP 132/80 | HR 80 | Temp 98.3°F | Wt 233.2 lb

## 2020-10-25 DIAGNOSIS — E7800 Pure hypercholesterolemia, unspecified: Secondary | ICD-10-CM | POA: Diagnosis not present

## 2020-10-25 DIAGNOSIS — R7309 Other abnormal glucose: Secondary | ICD-10-CM | POA: Diagnosis not present

## 2020-10-25 DIAGNOSIS — E559 Vitamin D deficiency, unspecified: Secondary | ICD-10-CM

## 2020-10-25 DIAGNOSIS — Z23 Encounter for immunization: Secondary | ICD-10-CM

## 2020-10-25 DIAGNOSIS — L439 Lichen planus, unspecified: Secondary | ICD-10-CM

## 2020-10-25 DIAGNOSIS — E669 Obesity, unspecified: Secondary | ICD-10-CM

## 2020-10-25 LAB — MICROALBUMIN, URINE WAIVED
Creatinine, Urine Waived: 200 mg/dL (ref 10–300)
Microalb, Ur Waived: 30 mg/L — ABNORMAL HIGH (ref 0–19)
Microalb/Creat Ratio: <30 mg/g (ref <30)

## 2020-10-25 LAB — BAYER DCA HB A1C WAIVED: HB A1C (BAYER DCA - WAIVED): 5.5 % (ref <7.0)

## 2020-10-25 MED ORDER — TRIAMCINOLONE ACETONIDE 0.1 % EX CREA: 1.0000 "application " | TOPICAL_CREAM | Freq: Two times a day (BID) | CUTANEOUS | 3 refills | Status: DC

## 2020-10-25 NOTE — Assessment & Plan Note (Signed)
Noted on past labs.  Recheck today, no medication at this time.  Diet focus. 

## 2020-10-25 NOTE — Progress Notes (Signed)
BP 132/80 (BP Location: Left Arm)   Pulse 80   Temp 98.3 F (36.8 C)   Wt 233 lb 3.7 oz (105.8 kg)   SpO2 97%   BMI 34.44 kg/m    Subjective:    Patient ID: Misty Reyes, female    DOB: 1981/09/25, 39 y.o.   MRN: [id number]  HPI: Misty Reyes is a 39 y.o. female  Chief Complaint  Patient presents with  . Prediabetes  . Vitamin D  . skin concern    pt states she has dark spots on her body she beleives they come from stress, states it has happened before    PREDIABETES Recent A1C in April 5.7% with underlying PCOS.  She also had a low Vitamin D level of 18.8 and is taking supplements -- 2000 units daily. Polydipsia/polyuria: no Visual disturbance: no Chest pain: no Paresthesias: no  The ASCVD Risk score Denman George DC Jr., et al., 2013) failed to calculate for the following reasons:   The 2013 ASCVD risk score is only valid for ages 31 to 21  SKIN LESION  Has had this issue before, her sister and mom had it.  Comes on with extreme stress.  Starts as a purple spot, then itches, dries up and leaves dark spot.  Comes every 15 years.  Has seen specialists and told her lichen planus -- she has used sister's Triamcinolone in past which works well. Duration: varies Location: various areas Painful: no Itching: yes Onset: gradual Context: not changing Associated signs and symptoms: none History of skin cancer: no History of precancerous skin lesions: no Family history of skin cancer: no  Relevant past medical, surgical, family and social history reviewed and updated as indicated. Interim medical history since our last visit reviewed. Allergies and medications reviewed and updated.  Review of Systems  Constitutional: Negative for activity change, appetite change, diaphoresis, fatigue and fever.  Respiratory: Negative for cough, chest tightness and shortness of breath.   Cardiovascular: Negative for chest pain, palpitations and leg swelling.  Gastrointestinal: Negative.   Skin:  Positive for rash.  Neurological: Negative.   Psychiatric/Behavioral: Negative.     Per HPI unless specifically indicated above     Objective:    BP 132/80 (BP Location: Left Arm)   Pulse 80   Temp 98.3 F (36.8 C)   Wt 233 lb 3.7 oz (105.8 kg)   SpO2 97%   BMI 34.44 kg/m   Wt Readings from Last 3 Encounters:  10/25/20 233 lb 3.7 oz (105.8 kg)  05/16/20 234 lb 6.4 oz (106.3 kg)  04/25/20 236 lb (107 kg)    Physical Exam Vitals and nursing note reviewed.  Constitutional:      General: She is awake. She is not in acute distress.    Appearance: She is well-developed and well-groomed. She is obese. She is not ill-appearing.  HENT:     Head: Normocephalic.     Right Ear: Hearing normal.     Left Ear: Hearing normal.  Eyes:     General: Lids are normal.        Right eye: No discharge.        Left eye: No discharge.     Conjunctiva/sclera: Conjunctivae normal.     Pupils: Pupils are equal, round, and reactive to light.  Cardiovascular:     Rate and Rhythm: Normal rate and regular rhythm.     Heart sounds: Normal heart sounds. No murmur heard.  No gallop.   Pulmonary:  Effort: Pulmonary effort is normal. No accessory muscle usage or respiratory distress.     Breath sounds: Normal breath sounds.  Abdominal:     General: Bowel sounds are normal.     Palpations: Abdomen is soft.  Musculoskeletal:     Cervical back: Normal range of motion and neck supple.     Right lower leg: No edema.     Left lower leg: No edema.  Skin:    General: Skin is warm and dry.     Findings: Rash present. Rash is papular.     Comments: To bilateral forearms, shins, and abdominal area.  Scattered papular areas, flat-topped, deeper purple, angulated with a few areas with scaling texture.  Neurological:     Mental Status: She is alert and oriented to person, place, and time.  Psychiatric:        Attention and Perception: Attention normal.        Mood and Affect: Mood normal.         Behavior: Behavior normal. Behavior is cooperative.        Thought Content: Thought content normal.        Judgment: Judgment normal.     Results for orders placed or performed in visit on 05/16/20  Surgical pathology  Result Value Ref Range   SURGICAL PATHOLOGY      SURGICAL PATHOLOGY CASE: [id number] PATIENT: Misty Reyes Surgical Pathology Report     Clinical History: cervical high risk HPV test positive (cm)   FINAL MICROSCOPIC DIAGNOSIS:  A. ENDOCERVIX, CURETTAGE: - Very scant fragments of benign transitional zone mucosa. - There is no evidence of malignancy.  B. CERVIX, 7 O'CLOCK, BIOPSY: - Densely inflamed transitional zone mucosa. - There is no evidence of malignancy.   GROSS DESCRIPTION:  A.  Received in formalin is blood tinged mucus that is entirely submitted in one block.  Volume: 0.1 by less than 0.1 by less than 0.1 cm (1 B)  B.  Received in formalin is a tan, soft tissue fragment that is submitted in toto.  Size: 0.6 cm, 1 block submitted.  (AK 05/17/2020)    Final Diagnosis performed by Pecola Leisure, MD.   Electronically signed 05/18/2020 Technical and / or Professional components performed at [facility]. [facility], [address] [place], [place] [address].  Immunohistochemistry Technic al component (if applicable) was performed at [facility]. [address], [place], [place] [address].   IMMUNOHISTOCHEMISTRY DISCLAIMER (if applicable): Some of these immunohistochemical stains may have been developed and the performance characteristics determine by [facility]. Some may not have been cleared or approved by the U.S. Food and Drug Administration. The FDA has determined that such clearance or approval is not necessary. This test is used for clinical purposes. It should not be regarded as investigational or for research. This laboratory is certified under the Clinical Laboratory Improvement  Amendments of 1988 (CLIA-88) as qualified to perform high complexity clinical laboratory testing.  The controls stained appropriately.       Assessment & Plan:   Problem List Items Addressed This Visit      Musculoskeletal and Integument   Lichen planus    Ongoing with flares during stressful periods of life.  Will send in script for Triamcinolone which has offered benefit in past.  Continue to monitor and consider dermatology referral if worsening or ongoing.        Other   Obesity (BMI 30.0-34.9)    BMI 34.44.  Recommended eating  smaller high protein, low fat meals more frequently and exercising 30 mins a day 5 times a week with a goal of 10-15lb weight loss in the next 3 months. Patient voiced their understanding and motivation to adhere to these recommendations.       Elevated hemoglobin A1c - Primary    Noted on past labs with recheck today 5.5%, trend down, and urine ALB 30.  Educated patient and recommend continued focus on diet and regular activity.      Relevant Orders   Bayer DCA Hb A1c Waived   Basic metabolic panel   Microalbumin, Urine Waived   Elevated LDL cholesterol level    Noted on past labs.  Recheck today, no medication at this time.  Diet focus.      Relevant Orders   Lipid Panel w/o Chol/HDL Ratio   Vitamin D deficiency    Noted on past labs, continue supplement daily and recheck level today.      Relevant Orders   VITAMIN D 25 Hydroxy (Vit-D Deficiency, Fractures)    Other Visit Diagnoses    Need for influenza vaccination       Flu vaccine today   Relevant Orders   Flu Vaccine QUAD 6+ mos PF IM (Fluarix Quad PF) (Completed)       Follow up plan: Return in about 7 months (around 05/14/2021) for Annual physical.

## 2020-10-25 NOTE — Assessment & Plan Note (Signed)
Noted on past labs with recheck today 5.5%, trend down, and urine ALB 30.  Educated patient and recommend continued focus on diet and regular activity.

## 2020-10-25 NOTE — Assessment & Plan Note (Signed)
Ongoing with flares during stressful periods of life.  Will send in script for Triamcinolone which has offered benefit in past.  Continue to monitor and consider dermatology referral if worsening or ongoing.

## 2020-10-25 NOTE — Assessment & Plan Note (Signed)
Noted on past labs, continue supplement daily and recheck level today.  

## 2020-10-25 NOTE — Patient Instructions (Signed)
DASH Eating Plan DASH stands for "Dietary Approaches to Stop Hypertension." The DASH eating plan is a healthy eating plan that has been shown to reduce high blood pressure (hypertension). It may also reduce your risk for type 2 diabetes, heart disease, and stroke. The DASH eating plan may also help with weight loss. What are tips for following this plan?  General guidelines  Avoid eating more than 2,300 mg (milligrams) of salt (sodium) a day. If you have hypertension, you may need to reduce your sodium intake to 1,500 mg a day.  Limit alcohol intake to no more than 1 drink a day for nonpregnant women and 2 drinks a day for men. One drink equals 12 oz of beer, 5 oz of wine, or 1 oz of hard liquor.  Work with your health care provider to maintain a healthy body weight or to lose weight. Ask what an ideal weight is for you.  Get at least 30 minutes of exercise that causes your heart to beat faster (aerobic exercise) most days of the week. Activities may include walking, swimming, or biking.  Work with your health care provider or diet and nutrition specialist (dietitian) to adjust your eating plan to your individual calorie needs. Reading food labels   Check food labels for the amount of sodium per serving. Choose foods with less than 5 percent of the Daily Value of sodium. Generally, foods with less than 300 mg of sodium per serving fit into this eating plan.  To find whole grains, look for the word "whole" as the first word in the ingredient list. Shopping  Buy products labeled as "low-sodium" or "no salt added."  Buy fresh foods. Avoid canned foods and premade or frozen meals. Cooking  Avoid adding salt when cooking. Use salt-free seasonings or herbs instead of table salt or sea salt. Check with your health care provider or pharmacist before using salt substitutes.  Do not fry foods. Cook foods using healthy methods such as baking, boiling, grilling, and broiling instead.  Cook with  heart-healthy oils, such as olive, canola, soybean, or sunflower oil. Meal planning  Eat a balanced diet that includes: ? 5 or more servings of fruits and vegetables each day. At each meal, try to fill half of your plate with fruits and vegetables. ? Up to 6-8 servings of whole grains each day. ? Less than 6 oz of lean meat, poultry, or fish each day. A 3-oz serving of meat is about the same size as a deck of cards. One egg equals 1 oz. ? 2 servings of low-fat dairy each day. ? A serving of nuts, seeds, or beans 5 times each week. ? Heart-healthy fats. Healthy fats called Omega-3 fatty acids are found in foods such as flaxseeds and coldwater fish, like sardines, salmon, and mackerel.  Limit how much you eat of the following: ? Canned or prepackaged foods. ? Food that is high in trans fat, such as fried foods. ? Food that is high in saturated fat, such as fatty meat. ? Sweets, desserts, sugary drinks, and other foods with added sugar. ? Full-fat dairy products.  Do not salt foods before eating.  Try to eat at least 2 vegetarian meals each week.  Eat more home-cooked food and less restaurant, buffet, and fast food.  When eating at a restaurant, ask that your food be prepared with less salt or no salt, if possible. What foods are recommended? The items listed may not be a complete list. Talk with your dietitian about   what dietary choices are best for you. Grains Whole-grain or whole-wheat bread. Whole-grain or whole-wheat pasta. Brown rice. Oatmeal. Quinoa. Bulgur. Whole-grain and low-sodium cereals. Pita bread. Low-fat, low-sodium crackers. Whole-wheat flour tortillas. Vegetables Fresh or frozen vegetables (raw, steamed, roasted, or grilled). Low-sodium or reduced-sodium tomato and vegetable juice. Low-sodium or reduced-sodium tomato sauce and tomato paste. Low-sodium or reduced-sodium canned vegetables. Fruits All fresh, dried, or frozen fruit. Canned fruit in natural juice (without  added sugar). Meat and other protein foods Skinless chicken or turkey. Ground chicken or turkey. Pork with fat trimmed off. Fish and seafood. Egg whites. Dried beans, peas, or lentils. Unsalted nuts, nut butters, and seeds. Unsalted canned beans. Lean cuts of beef with fat trimmed off. Low-sodium, lean deli meat. Dairy Low-fat (1%) or fat-free (skim) milk. Fat-free, low-fat, or reduced-fat cheeses. Nonfat, low-sodium ricotta or cottage cheese. Low-fat or nonfat yogurt. Low-fat, low-sodium cheese. Fats and oils Soft margarine without trans fats. Vegetable oil. Low-fat, reduced-fat, or light mayonnaise and salad dressings (reduced-sodium). Canola, safflower, olive, soybean, and sunflower oils. Avocado. Seasoning and other foods Herbs. Spices. Seasoning mixes without salt. Unsalted popcorn and pretzels. Fat-free sweets. What foods are not recommended? The items listed may not be a complete list. Talk with your dietitian about what dietary choices are best for you. Grains Baked goods made with fat, such as croissants, muffins, or some breads. Dry pasta or rice meal packs. Vegetables Creamed or fried vegetables. Vegetables in a cheese sauce. Regular canned vegetables (not low-sodium or reduced-sodium). Regular canned tomato sauce and paste (not low-sodium or reduced-sodium). Regular tomato and vegetable juice (not low-sodium or reduced-sodium). Pickles. Olives. Fruits Canned fruit in a light or heavy syrup. Fried fruit. Fruit in cream or butter sauce. Meat and other protein foods Fatty cuts of meat. Ribs. Fried meat. Bacon. Sausage. Bologna and other processed lunch meats. Salami. Fatback. Hotdogs. Bratwurst. Salted nuts and seeds. Canned beans with added salt. Canned or smoked fish. Whole eggs or egg yolks. Chicken or turkey with skin. Dairy Whole or 2% milk, cream, and half-and-half. Whole or full-fat cream cheese. Whole-fat or sweetened yogurt. Full-fat cheese. Nondairy creamers. Whipped toppings.  Processed cheese and cheese spreads. Fats and oils Butter. Stick margarine. Lard. Shortening. Ghee. Bacon fat. Tropical oils, such as coconut, palm kernel, or palm oil. Seasoning and other foods Salted popcorn and pretzels. Onion salt, garlic salt, seasoned salt, table salt, and sea salt. Worcestershire sauce. Tartar sauce. Barbecue sauce. Teriyaki sauce. Soy sauce, including reduced-sodium. Steak sauce. Canned and packaged gravies. Fish sauce. Oyster sauce. Cocktail sauce. Horseradish that you find on the shelf. Ketchup. Mustard. Meat flavorings and tenderizers. Bouillon cubes. Hot sauce and Tabasco sauce. Premade or packaged marinades. Premade or packaged taco seasonings. Relishes. Regular salad dressings. Where to find more information:  National Heart, Lung, and Blood Institute: www.nhlbi.nih.gov  American Heart Association: www.heart.org Summary  The DASH eating plan is a healthy eating plan that has been shown to reduce high blood pressure (hypertension). It may also reduce your risk for type 2 diabetes, heart disease, and stroke.  With the DASH eating plan, you should limit salt (sodium) intake to 2,300 mg a day. If you have hypertension, you may need to reduce your sodium intake to 1,500 mg a day.  When on the DASH eating plan, aim to eat more fresh fruits and vegetables, whole grains, lean proteins, low-fat dairy, and heart-healthy fats.  Work with your health care provider or diet and nutrition specialist (dietitian) to adjust your eating plan to your   individual calorie needs. This information is not intended to replace advice given to you by your health care provider. Make sure you discuss any questions you have with your health care provider. Document Revised: 11/07/2017 Document Reviewed: 11/18/2016 Elsevier Patient Education  2020 Elsevier Inc.  

## 2020-10-25 NOTE — Assessment & Plan Note (Signed)
BMI 34.44.  Recommended eating smaller high protein, low fat meals more frequently and exercising 30 mins a day 5 times a week with a goal of 10-15lb weight loss in the next 3 months. Patient voiced their understanding and motivation to adhere to these recommendations.

## 2020-10-26 LAB — BASIC METABOLIC PANEL
BUN/Creatinine Ratio: 13 (ref 9–23)
BUN: 9 mg/dL (ref 6–20)
CO2: 22 mmol/L (ref 20–29)
Calcium: 9.2 mg/dL (ref 8.7–10.2)
Chloride: 106 mmol/L (ref 96–106)
Creatinine, Ser: 0.71 mg/dL (ref 0.57–1.00)
GFR calc Af Amer: 124 mL/min/{1.73_m2} (ref >59)
GFR calc non Af Amer: 108 mL/min/{1.73_m2} (ref >59)
Glucose: 96 mg/dL (ref 65–99)
Potassium: 4.2 mmol/L (ref 3.5–5.2)
Sodium: 141 mmol/L (ref 134–144)

## 2020-10-26 LAB — LIPID PANEL W/O CHOL/HDL RATIO
Cholesterol, Total: 184 mg/dL (ref 100–199)
HDL: 48 mg/dL (ref >39)
LDL Chol Calc (NIH): 125 mg/dL — ABNORMAL HIGH (ref 0–99)
Triglycerides: 56 mg/dL (ref 0–149)
VLDL Cholesterol Cal: 11 mg/dL (ref 5–40)

## 2020-10-26 LAB — VITAMIN D 25 HYDROXY (VIT D DEFICIENCY, FRACTURES): Vit D, 25-Hydroxy: 33.3 ng/mL (ref 30.0–100.0)

## 2020-10-26 NOTE — Progress Notes (Signed)
Contacted via MyChart  Good morning Misty Reyes, your labs have returned.  Vitamin D level is improving with supplement on board, continue this.  Kidney function ans electrolytes are normal.  Your LDL remains above normal. The LDL is the bad cholesterol. Over time and in combination with inflammation and other factors, this contributes to plaque which in turn may lead to stroke and/or heart attack down the road. Sometimes high LDL is primarily genetic, and people might be eating all the right foods but still have high numbers. Other times, there is room for improvement in one's diet and eating healthier can bring this number down and potentially reduce one's risk of heart attack and/or stroke.   To reduce your LDL, Remember - more fruits and vegetables, more fish, and limit red meat and dairy products. More soy, nuts, beans, barley, lentils, oats and plant sterol ester enriched margarine instead of butter. I also encourage eliminating sugar and processed food. Remember, shop on the outside of the grocery store and visit your [employer]. If you would like to talk with me about dietary changes for your cholesterol, please let me know. We should recheck your cholesterol in 6-12 months. Keep being awesome!!  Thank you for allowing me to participate in your care. Kindest regards, Anzal Bartnick

## 2020-11-10 ENCOUNTER — Other Ambulatory Visit: Payer: Self-pay | Admitting: Nurse Practitioner

## 2020-11-10 MED ORDER — TRIAMCINOLONE ACETONIDE 0.1 % EX OINT: 1.0000 "application " | TOPICAL_OINTMENT | Freq: Two times a day (BID) | CUTANEOUS | 4 refills | Status: DC

## 2020-11-10 NOTE — Telephone Encounter (Signed)
Patient requesting for RX to be sent to [facility].

## 2021-05-31 ENCOUNTER — Encounter: Payer: Self-pay | Admitting: Nurse Practitioner

## 2021-05-31 ENCOUNTER — Other Ambulatory Visit (HOSPITAL_COMMUNITY)
Admission: RE | Admit: 2021-05-31 | Discharge: 2021-05-31 | Disposition: A | Discharge status: Home / Self Care | Payer: BC Managed Care – PPO | Source: Ambulatory Visit | Attending: Nurse Practitioner | Admitting: Nurse Practitioner

## 2021-05-31 ENCOUNTER — Ambulatory Visit (INDEPENDENT_AMBULATORY_CARE_PROVIDER_SITE_OTHER): Payer: BC Managed Care – PPO | Admitting: Nurse Practitioner

## 2021-05-31 ENCOUNTER — Other Ambulatory Visit: Payer: Self-pay

## 2021-05-31 VITALS — BP 129/85 | HR 88 | Temp 98.2°F | Ht 69.5 in | Wt 243.2 lb

## 2021-05-31 DIAGNOSIS — R7309 Other abnormal glucose: Secondary | ICD-10-CM

## 2021-05-31 DIAGNOSIS — E7800 Pure hypercholesterolemia, unspecified: Secondary | ICD-10-CM | POA: Diagnosis not present

## 2021-05-31 DIAGNOSIS — E559 Vitamin D deficiency, unspecified: Secondary | ICD-10-CM | POA: Diagnosis not present

## 2021-05-31 DIAGNOSIS — R87810 Cervical high risk human papillomavirus (HPV) DNA test positive: Secondary | ICD-10-CM | POA: Diagnosis not present

## 2021-05-31 DIAGNOSIS — Z0000 Encounter for general adult medical examination without abnormal findings: Secondary | ICD-10-CM | POA: Diagnosis not present

## 2021-05-31 DIAGNOSIS — L439 Lichen planus, unspecified: Secondary | ICD-10-CM

## 2021-05-31 DIAGNOSIS — E282 Polycystic ovarian syndrome: Secondary | ICD-10-CM | POA: Diagnosis not present

## 2021-05-31 DIAGNOSIS — E049 Nontoxic goiter, unspecified: Secondary | ICD-10-CM

## 2021-05-31 DIAGNOSIS — E669 Obesity, unspecified: Secondary | ICD-10-CM

## 2021-05-31 NOTE — Patient Instructions (Signed)
Pap Test Why am I having this test? A Pap test, also called a Pap smear, is a screening test to check for signs of: Cancer of the vagina, cervix, and uterus. The cervix is the lower part of the uterus that opens into the vagina. Infection. Changes that may be a sign that cancer is developing (precancerous changes). Women need this test on a regular basis. In general, you should have a Pap test every 3 years until you reach menopause or age 40. Women aged 30-60 may choose to have their Pap test done at the same time as an HPV (human papillomavirus) test every 5 years (instead of every 3 years). Your health care provider may recommend having Pap tests more or less oftendepending on your medical conditions and past Pap test results. What kind of sample is taken?  Your health care provider will collect a sample of cells from the surface of your cervix. This will be done using a small cotton swab, plastic spatula, or brush. This sample is often collected during a pelvic exam, when you are lying on your back on an exam table with feet in footrests (stirrups). In some cases, fluids (secretions) from the cervix or vagina may also be collected. How do I prepare for this test? Be aware of where you are in your menstrual cycle. If you are menstruating on the day of the test, you may be asked to reschedule. You may need to reschedule if you have a known vaginal infection on the day of the test. Follow instructions from your health care provider about: Changing or stopping your regular medicines. Some medicines can cause abnormal test results, such as digitalis and tetracycline. Avoiding douching or taking a bath the day before or the day of the test. Tell a health care provider about: Any allergies you have. All medicines you are taking, including vitamins, herbs, eye drops, creams, and over-the-counter medicines. Any blood disorders you have. Any surgeries you have had. Any medical conditions you  have. Whether you are pregnant or may be pregnant. How are the results reported? Your test results will be reported as either abnormal or normal. A false-positive result can occur. A false positive is incorrect because itmeans that a condition is present when it is not. A false-negative result can occur. A false negative is incorrect because itmeans that a condition is not present when it is. What do the results mean? A normal test result means that you do not have signs of cancer of the vagina,cervix, or uterus. An abnormal result may mean that you have: Cancer. A Pap test by itself is not enough to diagnose cancer. You will have more tests done in this case. Precancerous changes in your vagina, cervix, or uterus. Inflammation of the cervix. An STD (sexually transmitted disease). A fungal infection. A parasite infection. Talk with your health care provider about what your results mean. Questions to ask your health care provider Ask your health care provider, or the department that is doing the test: When will my results be ready? How will I get my results? What are my treatment options? What other tests do I need? What are my next steps? Summary In general, women should have a Pap test every 3 years until they reach menopause or age 40. Your health care provider will collect a sample of cells from the surface of your cervix. This will be done using a small cotton swab, plastic spatula, or brush. In some cases, fluids (secretions) from the cervix or   vagina may also be collected. This information is not intended to replace advice given to you by your health care provider. Make sure you discuss any questions you have with your healthcare provider. Document Revised: 11/07/2020 Document Reviewed: 07/28/2020 Elsevier Patient Education  2022 Elsevier Inc.  

## 2021-05-31 NOTE — Assessment & Plan Note (Signed)
Repeat pap today as recommended by GYN.  If abnormal then return to GYN.

## 2021-05-31 NOTE — Progress Notes (Signed)
BP 129/85   Pulse 88   Temp 98.2 F (36.8 C) (Oral)   Ht 5' 9.5" (1.765 m)   Wt 243 lb 3.2 oz (110.3 kg)   SpO2 97%   BMI 35.40 kg/m    Subjective:    Patient ID: Misty Reyes, female    DOB: 04/03/1981, 40 y.o.   MRN: [id number]  HPI: Misty Buckleryana Dolecki is a 40 y.o. female presenting on 05/31/2021 for comprehensive medical examination. Current medical complaints include:none  She currently lives with: sister and nephew Menopausal Symptoms: no   Presents today for physical exam.  Has history of PCOS, with A1c in November 5.5%.  Is followed by GYN, saw Dr. Jerene PitchSchuman 05/16/20 for LEEP.  Pap on 03/25/2019 with repeat 04/25/20 did note positive HPV and negative cytology.  Her pathology on LEEP was normal, but recommended repeat pap in on year.  Has low Vitamin D and is taking daily supplement.  On past exams have noted slightly enlarged thyroid, but asymptomatic and imaging 03/29/20 noted mild enlargement without nodules.  She does endorse struggling with weight loss, she would like to reach 180 lbs.  Currently 243 lbs.  Reports diet is good and not good, at work she takes too long to eat and not sure what to eat.  Overall, eats a healthy diet with whole grains and minimal high carb foods.  Feels she needs to cut down on portions.  Does not drink sodas, drinks water and smoothies.  Does not exercise during to week.  Wishes to try Golo herbal supplements.   Depression Screen done today and results listed below:  [facility] 05/31/2021 03/21/2020 02/10/2020 02/12/2019  Decreased Interest 0 0 0 0  Down, Depressed, Hopeless 0 1 0 0  PHQ - 2 Score 0 1 0 0  Altered sleeping 0 0 - 0  Tired, decreased energy 0 0 - 0  Change in appetite 0 0 - 0  Feeling bad or failure about yourself  0 0 - 0  Trouble concentrating 0 0 - 0  Moving slowly or fidgety/restless 0 0 - 0  Suicidal thoughts 0 0 - 0  PHQ-9 Score 0 1 - 0  Difficult doing work/chores - Not difficult at all - Not difficult at all     The patient does not have a history of falls. I did not complete a risk assessment for falls. A plan of care for falls was not documented.   Past Medical History:  Past Medical History:  Diagnosis Date   Obesity (BMI 30.0-34.9)    PCOS (polycystic ovarian syndrome)     Surgical History:  History reviewed. No pertinent surgical history.  Medications:  Current Outpatient Medications on File Prior to Visit  Medication Sig   triamcinolone ointment (KENALOG) 0.1 % Apply 1 application topically 2 (two) times daily.   No current facility-administered medications on file prior to visit.    Allergies:  No Known Allergies  Social History:  Social History   Socioeconomic History   Marital status: Single    Spouse name: Not on file   Number of children: Not on file   Years of education: 12   Highest education level: Bachelor's degree (e.g., BA, AB, BS)  Occupational History   Not on file  Tobacco Use   Smoking status: Never   Smokeless tobacco: Never  Vaping Use   Vaping Use: Never used  Substance and Sexual Activity   Alcohol use: Never   Drug use: Never   Sexual  activity: Not Currently    Birth control/protection: None  Other Topics Concern   Not on file  Social History Narrative   Not on file   Social Determinants of Health   Financial Resource Strain: Not on file  Food Insecurity: Not on file  Transportation Needs: Not on file  Physical Activity: Not on file  Stress: Not on file  Social Connections: Not on file  Intimate Partner Violence: Not on file   Social History   Tobacco Use  Smoking Status Never  Smokeless Tobacco Never   Social History   Substance and Sexual Activity  Alcohol Use Never    Family History:  Family History  Problem Relation Age of Onset   Diabetes Paternal Grandmother    Breast cancer Neg Hx    Ovarian cancer Neg Hx     Past medical history, surgical history, medications, allergies, family history and social history  reviewed with patient today and changes made to appropriate areas of the chart.   Review of Systems - negative All other ROS negative except what is listed above and in the HPI.      Objective:    BP 129/85   Pulse 88   Temp 98.2 F (36.8 C) (Oral)   Ht 5' 9.5" (1.765 m)   Wt 243 lb 3.2 oz (110.3 kg)   SpO2 97%   BMI 35.40 kg/m   Wt Readings from Last 3 Encounters:  05/31/21 243 lb 3.2 oz (110.3 kg)  10/25/20 233 lb 3.7 oz (105.8 kg)  05/16/20 234 lb 6.4 oz (106.3 kg)    Physical Exam Vitals and nursing note reviewed. Exam conducted with a chaperone present.  Constitutional:      General: She is awake. She is not in acute distress.    Appearance: She is well-developed. She is not ill-appearing.  HENT:     Head: Normocephalic and atraumatic.     Right Ear: Hearing, tympanic membrane, ear canal and external ear normal. No drainage.     Left Ear: Hearing, tympanic membrane, ear canal and external ear normal. No drainage.     Nose: Nose normal.     Right Sinus: No maxillary sinus tenderness or frontal sinus tenderness.     Left Sinus: No maxillary sinus tenderness or frontal sinus tenderness.     Mouth/Throat:     Mouth: Mucous membranes are moist.     Pharynx: Oropharynx is clear. Uvula midline. No pharyngeal swelling, oropharyngeal exudate or posterior oropharyngeal erythema.  Eyes:     General: Lids are normal.        Right eye: No discharge.        Left eye: No discharge.     Extraocular Movements: Extraocular movements intact.     Conjunctiva/sclera: Conjunctivae normal.     Pupils: Pupils are equal, round, and reactive to light.     Visual Fields: Right eye visual fields normal and left eye visual fields normal.  Neck:     Thyroid: Thyromegaly present. No thyroid mass or thyroid tenderness.     Vascular: No carotid bruit.     Trachea: Trachea normal.  Cardiovascular:     Rate and Rhythm: Normal rate and regular rhythm.     Heart sounds: Normal heart sounds. No  murmur heard.   No gallop.  Pulmonary:     Effort: Pulmonary effort is normal. No accessory muscle usage or respiratory distress.     Breath sounds: Normal breath sounds.  Chest:  Breasts:    Right:  Normal. No axillary adenopathy or supraclavicular adenopathy.     Left: Normal. No axillary adenopathy or supraclavicular adenopathy.  Abdominal:     General: Bowel sounds are normal.     Palpations: Abdomen is soft. There is no hepatomegaly or splenomegaly.     Tenderness: There is no abdominal tenderness.     Hernia: There is no hernia in the left inguinal area or right inguinal area.  Genitourinary:    Pubic Area: No rash or pubic lice.      Labia:        Right: No rash.        Left: No rash.      Vagina: Normal.     Cervix: Normal.     Uterus: Normal.      Adnexa: Right adnexa normal and left adnexa normal.  Musculoskeletal:        General: Normal range of motion.     Cervical back: Normal range of motion and neck supple.     Right lower leg: No edema.     Left lower leg: No edema.  Lymphadenopathy:     Head:     Right side of head: No submental, submandibular, tonsillar, preauricular or posterior auricular adenopathy.     Left side of head: No submental, submandibular, tonsillar, preauricular or posterior auricular adenopathy.     Cervical: No cervical adenopathy.     Upper Body:     Right upper body: No supraclavicular, axillary or pectoral adenopathy.     Left upper body: No supraclavicular, axillary or pectoral adenopathy.  Skin:    General: Skin is warm and dry.     Capillary Refill: Capillary refill takes less than 2 seconds.     Findings: Rash present. Rash is macular and scaling.  Neurological:     Mental Status: She is alert and oriented to person, place, and time.     Cranial Nerves: Cranial nerves are intact.     Gait: Gait is intact.     Deep Tendon Reflexes: Reflexes are normal and symmetric.     Reflex Scores:      Brachioradialis reflexes are 2+ on the  right side and 2+ on the left side.      Patellar reflexes are 2+ on the right side and 2+ on the left side. Psychiatric:        Attention and Perception: Attention normal.        Mood and Affect: Mood normal.        Speech: Speech normal.        Behavior: Behavior normal. Behavior is cooperative.        Thought Content: Thought content normal.        Judgment: Judgment normal.   Results for orders placed or performed in visit on 10/25/20  Bayer DCA Hb A1c Waived  Result Value Ref Range   HB A1C (BAYER DCA - WAIVED) 5.5 <7.0 %  Basic metabolic panel  Result Value Ref Range   Glucose 96 65 - 99 mg/dL   BUN 9 6 - 20 mg/dL   Creatinine, Ser 3.71 0.57 - 1.00 mg/dL   GFR calc non Af Amer 108 >59 mL/min/1.73   GFR calc Af Amer 124 >59 mL/min/1.73   BUN/Creatinine Ratio 13 9 - 23   Sodium 141 134 - 144 mmol/L   Potassium 4.2 3.5 - 5.2 mmol/L   Chloride 106 96 - 106 mmol/L   CO2 22 20 - 29 mmol/L   Calcium 9.2 8.7 -  10.2 mg/dL  Lipid Panel w/o Chol/HDL Ratio  Result Value Ref Range   Cholesterol, Total 184 100 - 199 mg/dL   Triglycerides 56 0 - 149 mg/dL   HDL 48 >93 mg/dL   VLDL Cholesterol Cal 11 5 - 40 mg/dL   LDL Chol Calc (NIH) 810 (H) 0 - 99 mg/dL  VITAMIN D 25 Hydroxy (Vit-D Deficiency, Fractures)  Result Value Ref Range   Vit D, 25-Hydroxy 33.3 30.0 - 100.0 ng/mL  Microalbumin, Urine Waived  Result Value Ref Range   Microalb, Ur Waived 30 (H) 0 - 19 mg/L   Creatinine, Urine Waived 200 10 - 300 mg/dL   Microalb/Creat Ratio <30 <30 mg/g      Assessment & Plan:   Problem List Items Addressed This Visit       Endocrine   PCOS (polycystic ovarian syndrome) - Primary    Ongoing x 10 years, check A1c today.  Continue collaboration with GYN.  Initiate diabetes medication as needed dependent on labs.  Education on PCOS diet provided.       Enlarged thyroid    Mild thyroid enlargement on ultrasound with no nodules and no masses.  No symptoms, continue to monitor.        Relevant Orders   TSH     Musculoskeletal and Integument   Lichen planus    Ongoing, continue Triamcinolone and place referral to dermatology for further assessment.       Relevant Orders   Ambulatory referral to Dermatology     Other   Obesity (BMI 30.0-34.9)    BMI 35.40 with goal of loss.  Going to start Golo.  Recommended eating smaller high protein, low fat meals more frequently and exercising 30 mins a day 5 times a week with a goal of 10-15lb weight loss in the next 3 months. Patient voiced their understanding and motivation to adhere to these recommendations.  Return in 6 months for weight check.        Relevant Orders   CBC with Differential/Platelet   Comprehensive metabolic panel   Cervical high risk HPV (human papillomavirus) test positive    Repeat pap today as recommended by GYN.  If abnormal then return to GYN.       Relevant Orders   Cytology - PAP   Elevated hemoglobin A1c    Recent A1c stable, recheck today.       Relevant Orders   Comprehensive metabolic panel   HgB A1c   Elevated LDL cholesterol level    Noted on past labs.  Recheck today, no medication at this time.  Diet focus.       Relevant Orders   Lipid Panel w/o Chol/HDL Ratio   Vitamin D deficiency    Noted on past labs, continue supplement daily and recheck level today.       Relevant Orders   VITAMIN D 25 Hydroxy (Vit-D Deficiency, Fractures)   Other Visit Diagnoses     Annual physical exam       Annual labs today and repeat pap + health maintenance reviewed.        Follow up plan: Return in about 6 months (around 11/30/2021) for Weight check.   LABORATORY TESTING:  - Pap smear: repeat today as recommended by GYN  IMMUNIZATIONS:   - Tdap: Tetanus vaccination status reviewed: last tetanus booster within 10 years. - Influenza: Up to date - Pneumovax: Not applicable - Prevnar: Not applicable - HPV: Not applicable - Zostavax vaccine: Not  applicable  SCREENING: -Mammogram: Not applicable  - Colonoscopy: Not applicable  - Bone Density: Not applicable  -Hearing Test: Not applicable  -Spirometry: Not applicable   PATIENT COUNSELING:   Advised to take 1 mg of folate supplement per day if capable of pregnancy.   Sexuality: Discussed sexually transmitted diseases, partner selection, use of condoms, avoidance of unintended pregnancy  and contraceptive alternatives.   Advised to avoid cigarette smoking.  I discussed with the patient that most people either abstain from alcohol or drink within safe limits (<=14/week and <=4 drinks/occasion for males, <=7/weeks and <= 3 drinks/occasion for females) and that the risk for alcohol disorders and other health effects rises proportionally with the number of drinks per week and how often a drinker exceeds daily limits.  Discussed cessation/primary prevention of drug use and availability of treatment for abuse.   Diet: Encouraged to adjust caloric intake to maintain  or achieve ideal body weight, to reduce intake of dietary saturated fat and total fat, to limit sodium intake by avoiding high sodium foods and not adding table salt, and to maintain adequate dietary potassium and calcium preferably from fresh fruits, vegetables, and low-fat dairy products.    Stressed the importance of regular exercise  Injury prevention: Discussed safety belts, safety helmets, smoke detector, smoking near bedding or upholstery.   Dental health: Discussed importance of regular tooth brushing, flossing, and dental visits.    NEXT PREVENTATIVE PHYSICAL DUE IN 1 YEAR. Return in about 6 months (around 11/30/2021) for Weight check.

## 2021-05-31 NOTE — Assessment & Plan Note (Signed)
Noted on past labs, continue supplement daily and recheck level today.  

## 2021-05-31 NOTE — Assessment & Plan Note (Signed)
Recent A1c stable, recheck today.

## 2021-05-31 NOTE — Assessment & Plan Note (Signed)
BMI 35.40 with goal of loss.  Going to start Golo.  Recommended eating smaller high protein, low fat meals more frequently and exercising 30 mins a day 5 times a week with a goal of 10-15lb weight loss in the next 3 months. Patient voiced their understanding and motivation to adhere to these recommendations.  Return in 6 months for weight check.

## 2021-05-31 NOTE — Assessment & Plan Note (Signed)
Ongoing, continue Triamcinolone and place referral to dermatology for further assessment.

## 2021-05-31 NOTE — Assessment & Plan Note (Signed)
Ongoing x 10 years, check A1c today.  Continue collaboration with GYN.  Initiate diabetes medication as needed dependent on labs.  Education on PCOS diet provided.

## 2021-05-31 NOTE — Assessment & Plan Note (Signed)
Noted on past labs.  Recheck today, no medication at this time.  Diet focus.

## 2021-05-31 NOTE — Assessment & Plan Note (Signed)
Mild thyroid enlargement on ultrasound with no nodules and no masses.  No symptoms, continue to monitor.

## 2021-06-01 LAB — LIPID PANEL W/O CHOL/HDL RATIO
Cholesterol, Total: 193 mg/dL (ref 100–199)
HDL: 47 mg/dL (ref >39)
LDL Chol Calc (NIH): 131 mg/dL — ABNORMAL HIGH (ref 0–99)
Triglycerides: 83 mg/dL (ref 0–149)
VLDL Cholesterol Cal: 15 mg/dL (ref 5–40)

## 2021-06-01 LAB — CBC WITH DIFFERENTIAL/PLATELET
Basophils Absolute: 0 10*3/uL (ref 0.0–0.2)
Basos: 1 %
EOS (ABSOLUTE): 0.1 10*3/uL (ref 0.0–0.4)
Eos: 3 %
Hematocrit: 40 % (ref 34.0–46.6)
Hemoglobin: 12.9 g/dL (ref 11.1–15.9)
Immature Grans (Abs): 0 10*3/uL (ref 0.0–0.1)
Immature Granulocytes: 0 %
Lymphocytes Absolute: 1.7 10*3/uL (ref 0.7–3.1)
Lymphs: 39 %
MCH: 28 pg (ref 26.6–33.0)
MCHC: 32.3 g/dL (ref 31.5–35.7)
MCV: 87 fL (ref 79–97)
Monocytes Absolute: 0.4 10*3/uL (ref 0.1–0.9)
Monocytes: 10 %
Neutrophils Absolute: 2.2 10*3/uL (ref 1.4–7.0)
Neutrophils: 47 %
Platelets: 267 10*3/uL (ref 150–450)
RBC: 4.61 x10E6/uL (ref 3.77–5.28)
RDW: 13.3 % (ref 11.7–15.4)
WBC: 4.5 10*3/uL (ref 3.4–10.8)

## 2021-06-01 LAB — CYTOLOGY - PAP
Adequacy: ABSENT
Comment: NEGATIVE
Diagnosis: NEGATIVE
High risk HPV: POSITIVE — AB

## 2021-06-01 LAB — COMPREHENSIVE METABOLIC PANEL
ALT: 25 IU/L (ref 0–32)
AST: 20 IU/L (ref 0–40)
Albumin/Globulin Ratio: 1.3 (ref 1.2–2.2)
Albumin: 4.3 g/dL (ref 3.8–4.8)
Alkaline Phosphatase: 70 IU/L (ref 44–121)
BUN/Creatinine Ratio: 11 (ref 9–23)
BUN: 8 mg/dL (ref 6–20)
Bilirubin Total: 0.8 mg/dL (ref 0.0–1.2)
CO2: 23 mmol/L (ref 20–29)
Calcium: 9.1 mg/dL (ref 8.7–10.2)
Chloride: 104 mmol/L (ref 96–106)
Creatinine, Ser: 0.71 mg/dL (ref 0.57–1.00)
Globulin, Total: 3.4 g/dL (ref 1.5–4.5)
Glucose: 98 mg/dL (ref 65–99)
Potassium: 3.8 mmol/L (ref 3.5–5.2)
Sodium: 139 mmol/L (ref 134–144)
Total Protein: 7.7 g/dL (ref 6.0–8.5)
eGFR: 111 mL/min/{1.73_m2} (ref >59)

## 2021-06-01 LAB — VITAMIN D 25 HYDROXY (VIT D DEFICIENCY, FRACTURES): Vit D, 25-Hydroxy: 22.1 ng/mL — ABNORMAL LOW (ref 30.0–100.0)

## 2021-06-01 LAB — TSH: TSH: 1.27 u[IU]/mL (ref 0.450–4.500)

## 2021-06-01 LAB — HEMOGLOBIN A1C
Est. average glucose Bld gHb Est-mCnc: 126 mg/dL
Hgb A1c MFr Bld: 6 % — ABNORMAL HIGH (ref 4.8–5.6)

## 2021-06-01 NOTE — Progress Notes (Signed)
Contacted via MyChart   Good evening Misty Reyes, your labs have returned: - First off pap, I have bad news.  It did return HPV positive again, although cytology negative.  I would recommend you scheduled to see Dr. Jerene Pitch again with GYN, see if repeat biopsy needed or if we can repeat pap in one year. - A1c is creeping up to 6%.  It may be good in future to trial a small dose of Metformin or start Ozempic injectables since you are in prediabetic range, these also may help with weight loss goals.  Let me know is interested. - Vitamin D is on lower side again, recommend ensuring you take Vitamin D3 2000 units daily for bone and muscle health. - Thyroid, kidney, liver function all normal.  CBC shows no anemia. - Cholesterol levels continue to show elevated LDL, bad cholesterol, continue heavy diet focus reducing saturated fats and eating more fruits and vegetables.  Any questions? Keep being awesome!!  Thank you for allowing me to participate in your care.  I appreciate you. Kindest regards, Kashtyn Jankowski

## 2021-07-11 ENCOUNTER — Ambulatory Visit: Payer: BC Managed Care – PPO | Admitting: Obstetrics and Gynecology

## 2021-07-30 ENCOUNTER — Other Ambulatory Visit: Payer: Self-pay

## 2021-07-30 ENCOUNTER — Ambulatory Visit (INDEPENDENT_AMBULATORY_CARE_PROVIDER_SITE_OTHER): Payer: BC Managed Care – PPO | Admitting: Obstetrics and Gynecology

## 2021-07-30 ENCOUNTER — Encounter: Payer: Self-pay | Admitting: Obstetrics and Gynecology

## 2021-07-30 VITALS — BP 130/80 | Ht 70.0 in | Wt 243.3 lb

## 2021-07-30 DIAGNOSIS — R87618 Other abnormal cytological findings on specimens from cervix uteri: Secondary | ICD-10-CM | POA: Diagnosis not present

## 2021-07-30 NOTE — Progress Notes (Signed)
Patient ID: Misty Reyes, female   DOB: 1981-06-14, 40 y.o.   MRN: [id number]  Reason for Consult: Procedure   Referred by Marjie Skiff, NP  Subjective:     HPI:  Misty Reyes is a 40 y.o. female.  She is following up after a abnormal Pap smear with her primary care physician.  She would like to know if a colposcopy is needed today.  Pap smear timeline 05/31/2021:  NIL HPV positive 05/16/2021:  No CIN- scant fragments of benign transitional zone 04/25/2020:  NIL HPV positive 03/25/2019:  NIL HPV positive  Gynecological History  Patient's last menstrual period was 07/20/2021. Past Medical History:  Diagnosis Date   Obesity (BMI 30.0-34.9)    PCOS (polycystic ovarian syndrome)    Family History  Problem Relation Age of Onset   Diabetes Paternal Grandmother    Breast cancer Neg Hx    Ovarian cancer Neg Hx    No past surgical history on file.  Short Social History:  Social History   Tobacco Use   Smoking status: Never   Smokeless tobacco: Never  Substance Use Topics   Alcohol use: Never    No Known Allergies  Current Outpatient Medications  Medication Sig Dispense Refill   triamcinolone ointment (KENALOG) 0.1 % Apply 1 application topically 2 (two) times daily. 30 g 4   No current facility-administered medications for this visit.    Review of Systems  Constitutional: Negative for chills, fatigue, fever and unexpected weight change.  HENT: Negative for trouble swallowing.  Eyes: Negative for loss of vision.  Respiratory: Negative for cough, shortness of breath and wheezing.  Cardiovascular: Negative for chest pain, leg swelling, palpitations and syncope.  GI: Negative for abdominal pain, blood in stool, diarrhea, nausea and vomiting.  GU: Negative for difficulty urinating, dysuria, frequency and hematuria.  Musculoskeletal: Negative for back pain, leg pain and joint pain.  Skin: Negative for rash.  Neurological: Negative for dizziness, headaches,  light-headedness, numbness and seizures.  Psychiatric: Negative for behavioral problem, confusion, depressed mood and sleep disturbance.       Objective:  Objective   Vitals:   07/30/21 1445  BP: 130/80  Weight: 243 lb 4.8 oz (110.4 kg)  Height: 5\' 10"  (1.778 m)   Body mass index is 34.91 kg/m.  Physical Exam Vitals and nursing note reviewed. Exam conducted with a chaperone present.  Constitutional:      Appearance: Normal appearance.  HENT:     Head: Normocephalic and atraumatic.  Eyes:     Extraocular Movements: Extraocular movements intact.     Pupils: Pupils are equal, round, and reactive to light.  Cardiovascular:     Rate and Rhythm: Normal rate and regular rhythm.  Pulmonary:     Effort: Pulmonary effort is normal.     Breath sounds: Normal breath sounds.  Abdominal:     General: Abdomen is flat.     Palpations: Abdomen is soft.  Musculoskeletal:     Cervical back: Normal range of motion.  Skin:    General: Skin is warm and dry.  Neurological:     General: No focal deficit present.     Mental Status: She is alert and oriented to person, place, and time.  Psychiatric:        Behavior: Behavior normal.        Thought Content: Thought content normal.        Judgment: Judgment normal.    Assessment/Plan:    40 yo with abnormal pap smear  following colposcopy with No CIN.  ASCCP algorithm reviewed with patient.  Follow-up Pap smear with cytology recommended in 1 year, colposcopy not needed today.  If patient has continued HPV positive Pap smears then repeat colposcopy would be be recommended.  Consider testing for HPV 16 and 18 with next Pap smear.  More than 10 minutes were spent face to face with the patient in the room, reviewing the medical record, labs and images, and coordinating care for the patient. The plan of management was discussed in detail and counseling was provided.     Adelene Idler MD [facility], [facility] 07/30/2021 3:13 PM

## 2021-08-23 ENCOUNTER — Encounter: Payer: Self-pay | Admitting: Emergency Medicine

## 2021-08-23 ENCOUNTER — Other Ambulatory Visit: Payer: Self-pay

## 2021-08-23 ENCOUNTER — Ambulatory Visit: Payer: Self-pay | Admitting: *Deleted

## 2021-08-23 ENCOUNTER — Telehealth: Payer: Self-pay

## 2021-08-23 ENCOUNTER — Emergency Department: Payer: BC Managed Care – PPO

## 2021-08-23 ENCOUNTER — Emergency Department
Admission: EM | Admit: 2021-08-23 | Discharge: 2021-08-23 | Disposition: A | Discharge status: Home / Self Care | Payer: BC Managed Care – PPO | Attending: Emergency Medicine | Admitting: Emergency Medicine

## 2021-08-23 DIAGNOSIS — N200 Calculus of kidney: Secondary | ICD-10-CM | POA: Insufficient documentation

## 2021-08-23 DIAGNOSIS — R109 Unspecified abdominal pain: Secondary | ICD-10-CM | POA: Diagnosis present

## 2021-08-23 LAB — CBC
HCT: 37.8 % (ref 36.0–46.0)
Hemoglobin: 12.6 g/dL (ref 12.0–15.0)
MCH: 28.8 pg (ref 26.0–34.0)
MCHC: 33.3 g/dL (ref 30.0–36.0)
MCV: 86.5 fL (ref 80.0–100.0)
Platelets: 189 10*3/uL (ref 150–400)
RBC: 4.37 MIL/uL (ref 3.87–5.11)
RDW: 14.1 % (ref 11.5–15.5)
WBC: 10.2 10*3/uL (ref 4.0–10.5)
nRBC: 0 % (ref 0.0–0.2)

## 2021-08-23 LAB — URINALYSIS, COMPLETE (UACMP) WITH MICROSCOPIC
Bilirubin Urine: NEGATIVE
Glucose, UA: NEGATIVE mg/dL
Ketones, ur: NEGATIVE mg/dL
Leukocytes,Ua: NEGATIVE
Nitrite: NEGATIVE
Protein, ur: 30 mg/dL — AB
RBC / HPF: >50 RBC/hpf — ABNORMAL HIGH (ref 0–5)
Specific Gravity, Urine: 1.024 (ref 1.005–1.030)
pH: 7 (ref 5.0–8.0)

## 2021-08-23 LAB — COMPREHENSIVE METABOLIC PANEL
ALT: 26 U/L (ref 0–44)
AST: 33 U/L (ref 15–41)
Albumin: 4.2 g/dL (ref 3.5–5.0)
Alkaline Phosphatase: 51 U/L (ref 38–126)
Anion gap: 7 (ref 5–15)
BUN: 10 mg/dL (ref 6–20)
CO2: 24 mmol/L (ref 22–32)
Calcium: 8.7 mg/dL — ABNORMAL LOW (ref 8.9–10.3)
Chloride: 106 mmol/L (ref 98–111)
Creatinine, Ser: 0.82 mg/dL (ref 0.44–1.00)
GFR, Estimated: >60 mL/min (ref >60)
Glucose, Bld: 143 mg/dL — ABNORMAL HIGH (ref 70–99)
Potassium: 4.2 mmol/L (ref 3.5–5.1)
Sodium: 137 mmol/L (ref 135–145)
Total Bilirubin: 1.3 mg/dL — ABNORMAL HIGH (ref 0.3–1.2)
Total Protein: 7.9 g/dL (ref 6.5–8.1)

## 2021-08-23 LAB — PREGNANCY, URINE: Preg Test, Ur: NEGATIVE

## 2021-08-23 LAB — LIPASE, BLOOD: Lipase: 32 U/L (ref 11–51)

## 2021-08-23 MED ORDER — HYDROCODONE-ACETAMINOPHEN 5-325 MG PO TABS: 1.0000 | ORAL_TABLET | Freq: Four times a day (QID) | ORAL | 0 refills | Status: DC | PRN

## 2021-08-23 MED ORDER — OXYCODONE-ACETAMINOPHEN 5-325 MG PO TABS
1.0000 | ORAL_TABLET | ORAL | Status: DC | PRN
  Administered 2021-08-23: 1 via ORAL
  Filled 2021-08-23: qty 1

## 2021-08-23 MED ORDER — ONDANSETRON 4 MG PO TBDP
4.0000 mg | ORAL_TABLET | Freq: Once | ORAL | Status: AC
  Administered 2021-08-23: 4 mg via ORAL
  Filled 2021-08-23: qty 1

## 2021-08-23 MED ORDER — TAMSULOSIN HCL 0.4 MG PO CAPS: 0.4000 mg | ORAL_CAPSULE | Freq: Every day | ORAL | 0 refills | Status: AC

## 2021-08-23 MED ORDER — ONDANSETRON 4 MG PO TBDP: 4.0000 mg | ORAL_TABLET | Freq: Three times a day (TID) | ORAL | 0 refills | Status: DC | PRN

## 2021-08-23 NOTE — Telephone Encounter (Signed)
FYI to provider

## 2021-08-23 NOTE — Telephone Encounter (Signed)
Reason for Disposition  [1] SEVERE pain (e.g., excruciating) AND [2] present > 1 hour  Answer Assessment - Initial Assessment Questions 1. LOCATION: "Where does it hurt?"      Having right lower side abd pain.   I'm vomiting for 2 hours.     2. RADIATION: "Does the pain shoot anywhere else?" (e.g., chest, back)     To my lower right side of my back.   My urinary symptoms.are none.   I have PCOS.   It's never bothered me.   Just irregular periods. Diarrhea for 2 hours.  3. ONSET: "When did the pain begin?" (e.g., minutes, hours or days ago)      Started 5 am this morning  4. SUDDEN: "Gradual or sudden onset?"     Gradually has gotten worse.  It woke me up. 5. PATTERN "Does the pain come and go, or is it constant?"    - If constant: "Is it getting better, staying the same, or worsening?"      (Note: Constant means the pain never goes away completely; most serious pain is constant and it progresses)     - If intermittent: "How long does it last?" "Do you have pain now?"     (Note: Intermittent means the pain goes away completely between bouts)     I was fine when I went to bed last night. It's constant 6. SEVERITY: "How bad is the pain?"  (e.g., Scale 1-10; mild, moderate, or severe)   - MILD (1-3): doesn't interfere with normal activities, abdomen soft and not tender to touch    - MODERATE (4-7): interferes with normal activities or awakens from sleep, abdomen tender to touch    - SEVERE (8-10): excruciating pain, doubled over, unable to do any normal activities      10 7. RECURRENT SYMPTOM: "Have you ever had this type of stomach pain before?" If Yes, ask: "When was the last time?" and "What happened that time?"      No 8. CAUSE: "What do you think is causing the stomach pain?"     I don't know.  9. RELIEVING/AGGRAVATING FACTORS: "What makes it better or worse?" (e.g., movement, antacids, bowel movement)     No medications.    10. OTHER SYMPTOMS: "Do you have any other symptoms?"  (e.g., back pain, diarrhea, fever, urination pain, vomiting)       No sick exposures 11. PREGNANCY: "Is there any chance you are pregnant?" "When was your last menstrual period?"       No  Protocols used: Abdominal Pain - [facility]

## 2021-08-23 NOTE — Telephone Encounter (Signed)
Pt called in c/o waking up at 5:00 this morning with right lower abd pain with vomiting and diarrhea going on for the past 2 hours.   Pain is 10/10 on pain scale.  See triage notes.  I have referred her to the ED per the protocol. I sent sent my notes to [facility] for Aura Dials, NP

## 2021-08-23 NOTE — ED Triage Notes (Signed)
Pt comes into the ED via POV c/o RLQ abd pain and emesis.  Pt presents slumped over due to the pain at this time.  Pt called her MD who sent her here for evaluation.

## 2021-08-23 NOTE — ED Provider Notes (Addendum)
[facility] Emergency Department Provider Note   ____________________________________________   Event Date/Time   First MD Initiated Contact with Patient 08/23/21 1032     (approximate)  I have reviewed the triage vital signs and the nursing notes.   HISTORY  Chief Complaint Abdominal Pain    HPI Misty Reyes is a 40 y.o. female with no stated past medical history who presents for right-sided abdominal pain  LOCATION: Right-sided abdomen DURATION: 1 week prior to arrival TIMING: Worsening since onset SEVERITY: 10/10 QUALITY: Aching CONTEXT: Patient states she began having a right CVA pain approximate 1 week prior to arrival that now radiates around to the right side of her abdomen and down into the right groin MODIFYING FACTORS: Denies any exacerbating or relieving factors ASSOCIATED SYMPTOMS: Endorses associated nausea/vomiting   Per medical record review, patient has history of PCOS          Past Medical History:  Diagnosis Date   Obesity (BMI 30.0-34.9)    PCOS (polycystic ovarian syndrome)     Patient Active Problem List   Diagnosis Date Noted   Lichen planus 10/25/2020   Elevated hemoglobin A1c 10/22/2020   Elevated LDL cholesterol level 10/22/2020   Vitamin D deficiency 10/22/2020   Cervical high risk HPV (human papillomavirus) test positive 04/25/2020   Enlarged thyroid 03/21/2020   Obesity (BMI 30.0-34.9) 02/10/2020   PCOS (polycystic ovarian syndrome) 03/25/2019   Preventative health care 02/12/2019    History reviewed. No pertinent surgical history.  Prior to Admission medications   Medication Sig Start Date End Date Taking? Authorizing Provider  HYDROcodone-acetaminophen (NORCO) 5-325 MG tablet Take 1 tablet by mouth every 6 (six) hours as needed for moderate pain. 08/23/21  Yes Merwyn Katos, MD  ondansetron (ZOFRAN ODT) 4 MG disintegrating tablet Take 1 tablet (4 mg total) by mouth every 8 (eight) hours as needed  for nausea or vomiting. 08/23/21  Yes Allison Silva, Clent Jacks, MD  tamsulosin (FLOMAX) 0.4 MG CAPS capsule Take 1 capsule (0.4 mg total) by mouth daily for 7 days. 08/23/21 08/30/21 Yes Merwyn Katos, MD  triamcinolone ointment (KENALOG) 0.1 % Apply 1 application topically 2 (two) times daily. 11/10/20   Marjie Skiff, NP    Allergies Patient has no known allergies.  Family History  Problem Relation Age of Onset   Diabetes Paternal Grandmother    Breast cancer Neg Hx    Ovarian cancer Neg Hx     Social History Social History   Tobacco Use   Smoking status: Never   Smokeless tobacco: Never  Vaping Use   Vaping Use: Never used  Substance Use Topics   Alcohol use: Never   Drug use: Never    Review of Systems Constitutional: No fever/chills Eyes: No visual changes. ENT: No sore throat. Cardiovascular: Denies chest pain. Respiratory: Denies shortness of breath. Gastrointestinal: Endorses right-sided abdominal pain, nausea, and vomiting.  No diarrhea. Genitourinary: Negative for dysuria. Musculoskeletal: Negative for acute arthralgias Skin: Negative for rash. Neurological: Negative for headaches, weakness/numbness/paresthesias in any extremity Psychiatric: Negative for suicidal ideation/homicidal ideation   ____________________________________________   PHYSICAL EXAM:  VITAL SIGNS: ED Triage Vitals  Enc Vitals Group     BP 08/23/21 0957 (!) 141/95     Pulse Rate 08/23/21 0957 74     Resp 08/23/21 0957 19     Temp 08/23/21 0957 97.7 F (36.5 C)     Temp Source 08/23/21 0957 Oral     SpO2 08/23/21 0957 100 %  Weight 08/23/21 0905 243 lb 6.2 oz (110.4 kg)     Height 08/23/21 0905 5\' 10"  (1.778 m)     Head Circumference --      Peak Flow --      Pain Score 08/23/21 0905 10     Pain Loc --      Pain Edu? --      Excl. in GC? --    Constitutional: Alert and oriented. Well appearing and in no acute distress. Eyes: Conjunctivae are normal. PERRL. Head:  Atraumatic. Nose: No congestion/rhinnorhea. Mouth/Throat: Mucous membranes are moist. Neck: No stridor Cardiovascular: Grossly normal heart sounds.  Good peripheral circulation. Respiratory: Normal respiratory effort.  No retractions. Gastrointestinal: Soft and right CVA tenderness to percussion. No distention. Musculoskeletal: No obvious deformities Neurologic:  Normal speech and language. No gross focal neurologic deficits are appreciated. Skin:  Skin is warm and dry. No rash noted. Psychiatric: Mood and affect are normal. Speech and behavior are normal.  ____________________________________________   LABS (all labs ordered are listed, but only abnormal results are displayed)  Labs Reviewed  COMPREHENSIVE METABOLIC PANEL - Abnormal; Notable for the following components:      Result Value   Glucose, Bld 143 (*)    Calcium 8.7 (*)    Total Bilirubin 1.3 (*)    All other components within normal limits  URINALYSIS, COMPLETE (UACMP) WITH MICROSCOPIC - Abnormal; Notable for the following components:   Color, Urine YELLOW (*)    APPearance HAZY (*)    Hgb urine dipstick MODERATE (*)    Protein, ur 30 (*)    RBC / HPF >50 (*)    Bacteria, UA RARE (*)    All other components within normal limits  LIPASE, BLOOD  CBC  PREGNANCY, URINE   RADIOLOGY  ED MD interpretation: CT without contrast of the abdomen and pelvis shows a 3 mm stone in the right mid ureter with mild upstream hydroureteronephrosis and perinephric stranding  Official radiology report(s): CT Abdomen Pelvis Wo Contrast  Result Date: 08/23/2021 CLINICAL DATA:  Right lower quadrant pain EXAM: CT ABDOMEN AND PELVIS WITHOUT CONTRAST TECHNIQUE: Multidetector CT imaging of the abdomen and pelvis was performed following the standard protocol without IV contrast. COMPARISON:  None. FINDINGS: Lower chest: The lung bases are clear. The imaged heart is unremarkable. Hepatobiliary: The liver is markedly hypoattenuating consistent  with severe fatty infiltration. Ill-defined hyperdensity along the gallbladder fossa may reflect areas of fatty sparing. There are no focal lesions, within the confines of noncontrast technique. The gallbladder is unremarkable. There is no biliary ductal dilatation. Pancreas: Unremarkable Spleen: Unremarkable Adrenals/Urinary Tract: The adrenals are unremarkable. There is a 3 mm stone in the mid right ureter with mild upstream hydroureteronephrosis and perinephric stranding. The kidneys are otherwise unremarkable. No other focal lesions or stones are identified. There is no left hydronephrosis. The bladder is unremarkable. Stomach/Bowel: The stomach is unremarkable. There is no evidence of bowel obstruction. There is no abnormal bowel wall thickening or inflammatory change. The appendix is not definitively identified. Vascular/Lymphatic: The abdominal aorta is nonaneurysmal. There is no abdominal or pelvic lymphadenopathy. Reproductive: The uterus and adnexa are unremarkable. Other: There is scattered stranding/free fluid in the right hemiabdomen. There is no free air. Musculoskeletal: There is no acute osseous abnormality or aggressive osseous lesion. IMPRESSION: 1. 3 mm stone in the mid right ureter with mild upstream hydroureteronephrosis and perinephric stranding. 2. Severe fatty infiltration of the liver. Electronically Signed   By: 08/25/2021.D.  On: 08/23/2021 13:13    ____________________________________________   PROCEDURES  Procedure(s) performed (including Critical Care):  .1-3 Lead EKG Interpretation Performed by: Merwyn Katos, MD Authorized by: Merwyn Katos, MD     Interpretation: normal     ECG rate:  69   ECG rate assessment: normal     Rhythm: sinus rhythm     Ectopy: none     Conduction: normal     ____________________________________________   INITIAL IMPRESSION / ASSESSMENT AND PLAN / ED COURSE  As part of my medical decision making, I reviewed the following  data within the electronic medical record, if available:  Nursing notes reviewed and incorporated, Labs reviewed, EKG interpreted, Old chart reviewed, Radiograph reviewed and Notes from prior ED visits reviewed and incorporated        Patient presents for severe flank pain. Presentation most consistent with Renal Colic from a Non-infected Kidney Stone. Given History and Exam I have lower suspicion for atypical appendicitis, genital torsion, acute cholecystitis, AAA, Aortic Dissection, Serious Bacterial Illness or other emergent intraabdominal pathology.  Workup: CBC, BMP, CT Abd/Pelvis noncontrast, UA, reassess Findings: 3 mm right mid ureteral kidney stone Reassesment: Patient tolerating PO and pain controlled Disposition:  Discharge. Strict return precautions for infected stone or PO intolerance discussed.      ____________________________________________   FINAL CLINICAL IMPRESSION(S) / ED DIAGNOSES  Final diagnoses:  None     ED Discharge Orders          Ordered    HYDROcodone-acetaminophen (NORCO) 5-325 MG tablet  Every 6 hours PRN        08/23/21 1400    ondansetron (ZOFRAN ODT) 4 MG disintegrating tablet  Every 8 hours PRN        08/23/21 1400    tamsulosin (FLOMAX) 0.4 MG CAPS capsule  Daily        08/23/21 1400             Note:  This document was prepared using Dragon voice recognition software and may include unintentional dictation errors.    Merwyn Katos, MD 08/23/21 1424    Merwyn Katos, MD 08/23/21 1425

## 2021-08-23 NOTE — Telephone Encounter (Signed)
Noted, plan on follow-up after ER visit.

## 2021-08-23 NOTE — ED Notes (Signed)
See triage note  presents with right lower abd pain and flank pain  states pain started this am  pos.n/v

## 2021-08-23 NOTE — Telephone Encounter (Signed)
Copied from CRM [id number]. Topic: General - Inquiry >> Aug 23, 2021  3:17 PM Crist Infante wrote: Reason for CRM: pt called this morning with abdominal pain.  Nurse triage advised her to go to ED.  She did. Pt has a kidney stone.  Pt forgot to get a work note.  Would like to know if Jolene will fit her in for a virtual visit sometime tomorrow?  In order for a work note. Pt aware Jolene has gone for the day.   Routing to [employer]. Can a letter be written or do we need to see the patient?

## 2021-08-24 ENCOUNTER — Encounter: Payer: Self-pay | Admitting: Nurse Practitioner

## 2021-08-24 ENCOUNTER — Telehealth (INDEPENDENT_AMBULATORY_CARE_PROVIDER_SITE_OTHER): Payer: BC Managed Care – PPO | Admitting: Nurse Practitioner

## 2021-08-24 DIAGNOSIS — N200 Calculus of kidney: Secondary | ICD-10-CM | POA: Diagnosis not present

## 2021-08-24 DIAGNOSIS — Z87442 Personal history of urinary calculi: Secondary | ICD-10-CM | POA: Insufficient documentation

## 2021-08-24 NOTE — Progress Notes (Signed)
There were no vitals taken for this visit.   Subjective:    Patient ID: Misty Reyes, female    DOB: 1981-11-10, 40 y.o.   MRN: [id number]  HPI: Misty Reyes is a 40 y.o. female  Chief Complaint  Patient presents with   ER Follow Up    Pt states she was seen at the ER yesterday for extreme pain. States she was diagnosed with a kidney stone. Requesting a note to be out of work.    This visit was completed via video visit through MyChart due to the restrictions of the COVID-19 pandemic. All issues as above were discussed and addressed. Physical exam was done as above through visual confirmation on video through MyChart. If it was felt that the patient should be evaluated in the office, they were directed there. The patient verbally consented to this visit. Location of the patient: home Location of the provider: work Those involved with this call:  Provider: Aura Dials, DNP CMA: Malen Gauze, CMA Front Desk/Registration: Kandice Hams  Time spent on call:  21 minutes with patient face to face via video conference. More than 50% of this time was spent in counseling and coordination of care. 15 minutes total spent in review of patient's record and preparation of their chart.  I verified patient identity using two factors (patient name and date of birth). Patient consents verbally to being seen via telemedicine visit today.    ER FOLLOW UP Was diagnosed with kidney stone on 08/23/21. A 3 mm stone in mid right ureter -- to see Dr. Richardo Hanks.  Was started on Tamsulosin, Zofran ,and Norco in ER.  She reports pain is improving today -- pain had started yesterday at 0430.  Pain 5/10 today, improving -- to lower right abdomen to back.  No N&V and no blood in urine. Time since discharge: 24 hours Hospital/facility:[facility] Diagnosis: right kidney stone Procedures/tests: CT scan and labs Consultants: none New medications: as above Discharge instructions:  to follow-up with urology Status: stable    Relevant past medical, surgical, family and social history reviewed and updated as indicated. Interim medical history since our last visit reviewed. Allergies and medications reviewed and updated.  Review of Systems  Constitutional:  Negative for activity change, appetite change, diaphoresis, fatigue and fever.  Respiratory:  Negative for cough, chest tightness and shortness of breath.   Cardiovascular:  Negative for chest pain, palpitations and leg swelling.  Gastrointestinal:  Positive for abdominal pain and nausea. Negative for abdominal distention, blood in stool, constipation, diarrhea and vomiting.  Neurological: Negative.   Psychiatric/Behavioral: Negative.     Per HPI unless specifically indicated above     Objective:    There were no vitals taken for this visit.  Wt Readings from Last 3 Encounters:  08/23/21 243 lb 6.2 oz (110.4 kg)  07/30/21 243 lb 4.8 oz (110.4 kg)  05/31/21 243 lb 3.2 oz (110.3 kg)    Physical Exam Vitals and nursing note reviewed.  Constitutional:      General: She is awake. She is not in acute distress.    Appearance: She is well-developed and well-groomed. She is not ill-appearing or toxic-appearing.  HENT:     Head: Normocephalic.     Right Ear: Hearing normal.     Left Ear: Hearing normal.  Eyes:     General: Lids are normal.        Right eye: No discharge.        Left eye: No discharge.  Conjunctiva/sclera: Conjunctivae normal.  Pulmonary:     Effort: Pulmonary effort is normal. No accessory muscle usage or respiratory distress.  Musculoskeletal:     Cervical back: Normal range of motion.  Neurological:     Mental Status: She is alert and oriented to person, place, and time.  Psychiatric:        Attention and Perception: Attention normal.        Mood and Affect: Mood normal.        Behavior: Behavior normal. Behavior is cooperative.        Thought Content: Thought content normal.        Judgment: Judgment normal.    Results  for orders placed or performed during the hospital encounter of 08/23/21  Lipase, blood  Result Value Ref Range   Lipase 32 11 - 51 U/L  Comprehensive metabolic panel  Result Value Ref Range   Sodium 137 135 - 145 mmol/L   Potassium 4.2 3.5 - 5.1 mmol/L   Chloride 106 98 - 111 mmol/L   CO2 24 22 - 32 mmol/L   Glucose, Bld 143 (H) 70 - 99 mg/dL   BUN 10 6 - 20 mg/dL   Creatinine, Ser 1.44 0.44 - 1.00 mg/dL   Calcium 8.7 (L) 8.9 - 10.3 mg/dL   Total Protein 7.9 6.5 - 8.1 g/dL   Albumin 4.2 3.5 - 5.0 g/dL   AST 33 15 - 41 U/L   ALT 26 0 - 44 U/L   Alkaline Phosphatase 51 38 - 126 U/L   Total Bilirubin 1.3 (H) 0.3 - 1.2 mg/dL   GFR, Estimated >81 >85 mL/min   Anion gap 7 5 - 15  CBC  Result Value Ref Range   WBC 10.2 4.0 - 10.5 K/uL   RBC 4.37 3.87 - 5.11 MIL/uL   Hemoglobin 12.6 12.0 - 15.0 g/dL   HCT 63.1 49.7 - 02.6 %   MCV 86.5 80.0 - 100.0 fL   MCH 28.8 26.0 - 34.0 pg   MCHC 33.3 30.0 - 36.0 g/dL   RDW 37.8 58.8 - 50.2 %   Platelets 189 150 - 400 K/uL   nRBC 0.0 0.0 - 0.2 %  Urinalysis, Complete w Microscopic Urine, Clean Catch  Result Value Ref Range   Color, Urine YELLOW (A) YELLOW   APPearance HAZY (A) CLEAR   Specific Gravity, Urine 1.024 1.005 - 1.030   pH 7.0 5.0 - 8.0   Glucose, UA NEGATIVE NEGATIVE mg/dL   Hgb urine dipstick MODERATE (A) NEGATIVE   Bilirubin Urine NEGATIVE NEGATIVE   Ketones, ur NEGATIVE NEGATIVE mg/dL   Protein, ur 30 (A) NEGATIVE mg/dL   Nitrite NEGATIVE NEGATIVE   Leukocytes,Ua NEGATIVE NEGATIVE   RBC / HPF >50 (H) 0 - 5 RBC/hpf   WBC, UA 0-5 0 - 5 WBC/hpf   Bacteria, UA RARE (A) NONE SEEN   Squamous Epithelial / LPF 0-5 0 - 5   Mucus PRESENT   Pregnancy, urine  Result Value Ref Range   Preg Test, Ur NEGATIVE NEGATIVE      Assessment & Plan:   Problem List Items Addressed This Visit       Genitourinary   Kidney stone - Primary    Acute, noted in ER on 08/23/21 -- 3 mm right side.  Referral to urology placed.  Continue all  current medications at this time.  Plan for return in 4 weeks -- need to check A1c.      Relevant Orders   Ambulatory  referral to Urology    I discussed the assessment and treatment plan with the patient. The patient was provided an opportunity to ask questions and all were answered. The patient agreed with the plan and demonstrated an understanding of the instructions.   The patient was advised to call back or seek an in-person evaluation if the symptoms worsen or if the condition fails to improve as anticipated.   I provided 21+ minutes of time during this encounter.   Follow up plan: Return in about 4 weeks (around 09/21/2021) for IFG and kidney stone -- check a1c.

## 2021-08-24 NOTE — Telephone Encounter (Signed)
Pt scheduled for today at 3:40 for virtual visit

## 2021-08-24 NOTE — Assessment & Plan Note (Signed)
Acute, noted in ER on 08/23/21 -- 3 mm right side.  Referral to urology placed.  Continue all current medications at this time.  Plan for return in 4 weeks -- need to check A1c.

## 2021-08-24 NOTE — Patient Instructions (Signed)
Low-Purine Eating Plan A low-purine eating plan involves making food choices to limit your intake of purine. Purine is a kind of uric acid. Too much uric acid in your blood can cause certain conditions, such as gout and kidney stones. Eating a low-purinediet can help control these conditions. What are tips for following this plan? Reading food labels Avoid foods with saturated or Trans fat. Check the ingredient list of grains-based foods, such as bread and cereal, to make sure that they contain whole grains. Check the ingredient list of sauces or soups to make sure they do not contain meat or fish. When choosing soft drinks, check the ingredient list to make sure they do not contain high-fructose corn syrup. Shopping  Buy plenty of fresh fruits and vegetables. Avoid buying canned or fresh fish. Buy dairy products labeled as low-fat or nonfat. Avoid buying premade or processed foods. These foods are often high in fat, salt (sodium), and added sugar.  Cooking Use olive oil instead of butter when cooking. Oils like olive oil, canola oil, and sunflower oil contain healthy fats. Meal planning Learn which foods do or do not affect you. If you find out that a food tends to cause your gout symptoms to flare up, avoid eating that food. You can enjoy foods that do not cause problems. If you have any questions about a food item, talk with your dietitian or health care provider. Limit foods high in fat, especially saturated fat. Fat makes it harder for your body to get rid of uric acid. Choose foods that are lower in fat and are lean sources of protein. General guidelines Limit alcohol intake to no more than 1 drink a day for nonpregnant women and 2 drinks a day for men. One drink equals 12 oz of beer, 5 oz of wine, or 1 oz of hard liquor. Alcohol can affect the way your body gets rid of uric acid. Drink plenty of water to keep your urine clear or pale yellow. Fluids can help remove uric acid from your  body. If directed by your health care provider, take a vitamin C supplement. Work with your health care provider and dietitian to develop a plan to achieve or maintain a healthy weight. Losing weight can help reduce uric acid in your blood. What foods are recommended? The items listed may not be a complete list. Talk with your dietitian aboutwhat dietary choices are best for you. Foods low in purines Foods low in purines do not need to be limited. These include: All fruits. All low-purine vegetables, pickles, and olives. Breads, pasta, rice, cornbread, and popcorn. Cake and other baked goods. All dairy foods. Eggs, nuts, and nut butters. Spices and condiments, such as salt, herbs, and vinegar. Plant oils, butter, and margarine. Water, sugar-free soft drinks, tea, coffee, and cocoa. Vegetable-based soups, broths, sauces, and gravies. Foods moderate in purines Foods moderate in purines should be limited to the amounts listed.  cup of asparagus, cauliflower, spinach, mushrooms, or green peas, each day. 2/3 cup uncooked oatmeal, each day.  cup dry wheat bran or wheat germ, each day. 2-3 ounces of meat or poultry, each day. 4-6 ounces of shellfish, such as crab, lobster, oysters, or shrimp, each day. 1 cup cooked beans, peas, or lentils, each day. Soup, broths, or bouillon made from meat or fish. Limit these foods as much as possible. What foods are not recommended? The items listed may not be a complete list. Talk with your dietitian aboutwhat dietary choices are best for you.   Limit your intake of foods high in purines, including: Beer and other alcohol. Meat-based gravy or sauce. Canned or fresh fish, such as: Anchovies, sardines, herring, and tuna. Mussels and scallops. Codfish, trout, and haddock. Bacon. Organ meats, such as: Liver or kidney. Tripe. Sweetbreads (thymus gland or pancreas). Wild game or goose. Yeast or yeast extract supplements. Drinks sweetened with  high-fructose corn syrup. Summary Eating a low-purine diet can help control conditions caused by too much uric acid in the body, such as gout or kidney stones. Choose low-purine foods, limit alcohol, and limit foods high in fat. You will learn over time which foods do or do not affect you. If you find out that a food tends to cause your gout symptoms to flare up, avoid eating that food. This information is not intended to replace advice given to you by your health care provider. Make sure you discuss any questions you have with your healthcare provider. Document Revised: 03/09/2020 Document Reviewed: 03/09/2020 Elsevier Patient Education  2022 [employer]  

## 2021-09-07 ENCOUNTER — Ambulatory Visit: Payer: BC Managed Care – PPO | Admitting: Urology

## 2021-09-07 ENCOUNTER — Ambulatory Visit
Admission: RE | Admit: 2021-09-07 | Discharge: 2021-09-07 | Disposition: A | Discharge status: Home / Self Care | Payer: BC Managed Care – PPO | Source: Ambulatory Visit | Attending: Urology | Admitting: Urology

## 2021-09-07 ENCOUNTER — Encounter: Payer: Self-pay | Admitting: Urology

## 2021-09-07 ENCOUNTER — Other Ambulatory Visit: Payer: Self-pay

## 2021-09-07 ENCOUNTER — Ambulatory Visit (INDEPENDENT_AMBULATORY_CARE_PROVIDER_SITE_OTHER): Payer: BC Managed Care – PPO | Admitting: Urology

## 2021-09-07 VITALS — BP 137/95 | HR 93 | Ht 69.0 in | Wt 243.0 lb

## 2021-09-07 DIAGNOSIS — N200 Calculus of kidney: Secondary | ICD-10-CM

## 2021-09-07 DIAGNOSIS — N132 Hydronephrosis with renal and ureteral calculous obstruction: Secondary | ICD-10-CM | POA: Diagnosis not present

## 2021-09-07 DIAGNOSIS — N201 Calculus of ureter: Secondary | ICD-10-CM

## 2021-09-07 DIAGNOSIS — N23 Unspecified renal colic: Secondary | ICD-10-CM

## 2021-09-07 LAB — URINALYSIS, COMPLETE
Bilirubin, UA: NEGATIVE
Glucose, UA: NEGATIVE
Ketones, UA: NEGATIVE
Leukocytes,UA: NEGATIVE
Nitrite, UA: NEGATIVE
Specific Gravity, UA: >1.03 — ABNORMAL HIGH (ref 1.005–1.030)
Urobilinogen, Ur: 0.2 mg/dL (ref 0.2–1.0)
pH, UA: 5.5 (ref 5.0–7.5)

## 2021-09-07 LAB — MICROSCOPIC EXAMINATION: Epithelial Cells (non renal): >10 /hpf — AB (ref 0–10)

## 2021-09-07 MED ORDER — TAMSULOSIN HCL 0.4 MG PO CAPS: 0.4000 mg | ORAL_CAPSULE | Freq: Every day | ORAL | 0 refills | Status: DC

## 2021-09-07 NOTE — Patient Instructions (Signed)
Kidney Stones ?Kidney stones are solid, rock-like deposits that form inside of the kidneys. The kidneys are a pair of organs that make urine. A kidney stone may form in a kidney and move into other parts of the urinary tract, including the tubes that connect the kidneys to the bladder (ureters), the bladder, and the tube that carries urine out of the body (urethra). As the stone moves through these areas, it can cause intense pain and block the flow of urine. ?Kidney stones are created when high levels of certain minerals are found in the urine. The stones are usually passed out of the body through urination, but in some cases, medical treatment may be needed to remove them. ?What are the causes? ?Kidney stones may be caused by: ?A condition in which certain glands produce too much parathyroid hormone (primary hyperparathyroidism), which causes too much calcium buildup in the blood. ?A buildup of uric acid crystals in the bladder (hyperuricosuria). Uric acid is a chemical that the body produces when you eat certain foods. It usually exits the body in the urine. ?Narrowing (stricture) of one or both of the ureters. ?A kidney blockage that is present at birth (congenital obstruction). ?Past surgery on the kidney or the ureters, such as gastric bypass surgery. ?What increases the risk? ?The following factors may make you more likely to develop this condition: ?Having had a kidney stone in the past. ?Having a family history of kidney stones. ?Not drinking enough water. ?Eating a diet that is high in protein, salt (sodium), or sugar. ?Being overweight or obese. ?What are the signs or symptoms? ?Symptoms of a kidney stone may include: ?Pain in the side of the abdomen, right below the ribs (flank pain). Pain usually spreads (radiates) to the groin. ?Needing to urinate frequently or urgently. ?Painful urination. ?Blood in the urine (hematuria). ?Nausea. ?Vomiting. ?Fever and chills. ?How is this diagnosed? ?This condition  may be diagnosed based on: ?Your symptoms and medical history. ?A physical exam. ?Blood tests. ?Urine tests. These may be done before and after the stone passes out of your body through urination. ?Imaging tests, such as a CT scan, abdominal X-ray, or ultrasound. ?A procedure to examine the inside of the bladder (cystoscopy). ?How is this treated? ?Treatment for kidney stones depends on the size, location, and makeup of the stones. Kidney stones will often pass out of the body through urination. You may need to: ?Increase your fluid intake to help pass the stone. In some cases, you may be given fluids through an IV and may need to be monitored at the hospital. ?Take medicine for pain. ?Make changes in your diet to help prevent kidney stones from coming back. ?Sometimes, medical procedures are needed to remove a kidney stone. This may involve: ?A procedure to break up kidney stones using: ?A focused beam of light (laser therapy). ?Shock waves (extracorporeal shock wave lithotripsy). ?Surgery to remove kidney stones. This may be needed if you have severe pain or have stones that block your urinary tract. ?Follow these instructions at home: ?Medicines ?Take over-the-counter and prescription medicines only as told by your health care provider. ?Ask your health care provider if the medicine prescribed to you requires you to avoid driving or using heavy machinery. ?Eating and drinking ?Drink enough fluid to keep your urine pale yellow. You may be instructed to drink at least 8-10 glasses of water each day. This will help you pass the kidney stone. ?If directed, change your diet. This may include: ?Limiting how much sodium   you eat. ?Eating more fruits and vegetables. ?Limiting how much animal protein--such as red meat, poultry, fish, and eggs--you eat. ?Follow instructions from your health care provider about eating or drinking restrictions. ?General instructions ?Collect urine samples as told by your health care provider.  You may need to collect a urine sample: ?24 hours after you pass the stone. ?8-12 weeks after passing the kidney stone, and every 6-12 months after that. ?Strain your urine every time you urinate, for as long as directed. Use the strainer that your health care provider recommends. ?Do not throw out the kidney stone after passing it. Keep the stone so it can be tested by your health care provider. Testing the makeup of your kidney stone may help prevent you from getting kidney stones in the future. ?Keep all follow-up visits as told by your health care provider. This is important. You may need follow-up X-rays or ultrasounds to make sure that your stone has passed. ?How is this prevented? ?To prevent another kidney stone: ?Drink enough fluid to keep your urine pale yellow. This is the best way to prevent kidney stones. ?Eat a healthy diet and follow recommendations from your health care provider about foods to avoid. You may be instructed to eat a low-protein diet. Recommendations vary depending on the type of kidney stone that you have. ?Maintain a healthy weight. ?Where to find more information ?National Kidney Foundation (NKF): www.kidney.org ?[employer] (UCF): www.urologyhealth.org ?Contact a health care provider if: ?You have pain that gets worse or does not get better with medicine. ?Get help right away if: ?You have a fever or chills. ?You develop severe pain. ?You develop new abdominal pain. ?You faint. ?You are unable to urinate. ?Summary ?Kidney stones are solid, rock-like deposits that form inside of the kidneys. ?Kidney stones can cause nausea, vomiting, blood in the urine, abdominal pain, and the urge to urinate frequently. ?Treatment for kidney stones depends on the size, location, and makeup of the stones. Kidney stones will often pass out of the body through urination. ?Kidney stones can be prevented by drinking enough fluids, eating a healthy diet, and maintaining a healthy weight. ?This  information is not intended to replace advice given to you by your health care provider. Make sure you discuss any questions you have with your health care provider. ?Document Revised: 04/09/2019 Document Reviewed: 04/13/2019 ?Elsevier Patient Education ? 2022 Elsevier Inc. ? ?

## 2021-09-07 NOTE — Progress Notes (Signed)
09/07/2021 11:53 AM   Misty Reyes 08/14/1981 [id number]  Referring provider: Venita Lick, NP [address] [place],  [place] [address]  Chief Complaint  Patient presents with   Nephrolithiasis    HPI: Misty Reyes is a 40 y.o. female who presents for follow-up of recent ED visit.  Presented to [facility] ED 08/23/2021 with a 1 week history of right abdominal pain which became severe and rated 10/10 on the day of her ED visit Pain began in right flank and radiated to right lower quadrant + Nausea with vomiting No fever/chills UA >50 RBC Was treated with oxycodone and Zofran Stone protocol CT with a 3 mm right mid ureteral calculus with mild hydronephrosis/hydroureter Discharged on hydrocodone, Zofran and tamsulosin Since ED visit her severe flank pain and nausea have resolved  She is voiding every 30-60 minutes with voiding small amounts and sensation of incomplete emptying.  Mild dysuria on occasion Her tamsulosin was only prescribed for 7 days and she has been off for 1 week   PMH: Past Medical History:  Diagnosis Date   Obesity (BMI 30.0-34.9)    PCOS (polycystic ovarian syndrome)     Surgical History: No past surgical history on file.  Home Medications:  Allergies as of 09/07/2021   No Known Allergies      Medication List        Accurate as of September 07, 2021 11:53 AM. If you have any questions, ask your nurse or doctor.          HYDROcodone-acetaminophen 5-325 MG tablet Commonly known as: Norco Take 1 tablet by mouth every 6 (six) hours as needed for moderate pain.   ondansetron 4 MG disintegrating tablet Commonly known as: Zofran ODT Take 1 tablet (4 mg total) by mouth every 8 (eight) hours as needed for nausea or vomiting.   triamcinolone ointment 0.1 % Commonly known as: KENALOG Apply 1 application topically 2 (two) times daily.        Allergies: No Known Allergies  Family History: Family History  Problem Relation Age of Onset    Diabetes Paternal Grandmother    Breast cancer Neg Hx    Ovarian cancer Neg Hx     Social History:  reports that she has never smoked. She has never used smokeless tobacco. She reports that she does not drink alcohol and does not use drugs.   Physical Exam: There were no vitals taken for this visit.  Constitutional:  Alert and oriented, No acute distress. HEENT: [place] AT, moist mucus membranes.  Trachea midline, no masses. Cardiovascular: No clubbing, cyanosis, or edema. Respiratory: Normal respiratory effort, no increased work of breathing. Psychiatric: Normal mood and affect.  Laboratory Data:  Lab Results  Component Value Date   CREATININE 0.82 08/23/2021    Urinalysis    Component Value Date/Time   COLORURINE YELLOW (A) 08/23/2021 0901   APPEARANCEUR HAZY (A) 08/23/2021 0901   LABSPEC 1.024 08/23/2021 0901   PHURINE 7.0 08/23/2021 0901   GLUCOSEU NEGATIVE 08/23/2021 0901   HGBUR MODERATE (A) 08/23/2021 0901   BILIRUBINUR NEGATIVE 08/23/2021 0901   KETONESUR NEGATIVE 08/23/2021 0901   PROTEINUR 30 (A) 08/23/2021 0901   NITRITE NEGATIVE 08/23/2021 0901   LEUKOCYTESUR NEGATIVE 08/23/2021 0901    Lab Results  Component Value Date   BACTERIA RARE (A) 08/23/2021    Pertinent Imaging: CT images were personally reviewed and interpreted   Assessment & Plan:    1.  Right ureteral calculus We discussed various treatment options for  urolithiasis including observation with or without medical expulsive therapy, shockwave lithotripsy (SWL), ureteroscopy and laser lithotripsy with stent placement, and percutaneous nephrolithotomy.  We discussed that management is based on stone size, location, density, patient co-morbidities, and patient preference.   Stones <44m in size have a >80% spontaneous passage rate. Data surrounding the use of tamsulosin for medical expulsive therapy is controversial, but meta analyses suggests it is most efficacious for distal stones between 5-174m in size. Possible side effects include dizziness/lightheadedness, and retrograde ejaculation.  SWL has a lower stone free rate in a single procedure, but also a lower complication rate compared to ureteroscopy and avoids a stent and associated stent related symptoms. Possible complications include renal hematoma, steinstrasse, and need for additional treatment.  Ureteroscopy with laser lithotripsy and stent placement has a higher stone free rate than SWL in a single procedure, however increased complication rate including possible infection, ureteral injury, bleeding, and stent related morbidity. Common stent related symptoms include dysuria, urgency/frequency, and flank pain.  PCNL is the favored treatment for stones >2cm. It involves a small incision in the flank, with complete fragmentation of stones and removal. It has the highest stone free rate, but also the highest complication rate. Possible complications include bleeding, infection/sepsis, injury to surrounding organs including the pleura, and collecting system injury.   After an extensive discussion of the risks and benefits of the above treatment options, the patient would like to proceed with MET.  Tamsulosin was refilled and KUB ordered  Follow-up 2 weeks and instructed to call earlier if she decides SWL or URS   ScAbbie SonsMD  [place] [phone]

## 2021-09-10 ENCOUNTER — Telehealth: Payer: Self-pay | Admitting: *Deleted

## 2021-09-10 NOTE — Telephone Encounter (Signed)
Patient notified

## 2021-09-10 NOTE — Telephone Encounter (Signed)
-----   Message from Riki Altes, MD sent at 09/09/2021  1:06 PM EDT ----- KUB reviewed and shows stone near the bladder, close to passing.  Follow-up as scheduled

## 2021-09-21 ENCOUNTER — Other Ambulatory Visit: Payer: Self-pay

## 2021-09-21 ENCOUNTER — Encounter: Payer: Self-pay | Admitting: Nurse Practitioner

## 2021-09-21 ENCOUNTER — Ambulatory Visit: Payer: BC Managed Care – PPO | Admitting: Nurse Practitioner

## 2021-09-21 VITALS — BP 134/88 | HR 86 | Temp 98.3°F | Wt 238.8 lb

## 2021-09-21 DIAGNOSIS — R7309 Other abnormal glucose: Secondary | ICD-10-CM

## 2021-09-21 DIAGNOSIS — E6609 Other obesity due to excess calories: Secondary | ICD-10-CM

## 2021-09-21 DIAGNOSIS — Z23 Encounter for immunization: Secondary | ICD-10-CM | POA: Diagnosis not present

## 2021-09-21 DIAGNOSIS — N200 Calculus of kidney: Secondary | ICD-10-CM

## 2021-09-21 DIAGNOSIS — Z6835 Body mass index (BMI) 35.0-35.9, adult: Secondary | ICD-10-CM

## 2021-09-21 LAB — URINALYSIS, ROUTINE W REFLEX MICROSCOPIC
Bilirubin, UA: NEGATIVE
Glucose, UA: NEGATIVE
Ketones, UA: NEGATIVE
Nitrite, UA: NEGATIVE
Protein,UA: NEGATIVE
Specific Gravity, UA: 1.025 (ref 1.005–1.030)
Urobilinogen, Ur: 0.2 mg/dL (ref 0.2–1.0)
pH, UA: 5.5 (ref 5.0–7.5)

## 2021-09-21 LAB — MICROSCOPIC EXAMINATION

## 2021-09-21 LAB — BAYER DCA HB A1C WAIVED: HB A1C (BAYER DCA - WAIVED): 5.5 % (ref 4.8–5.6)

## 2021-09-21 NOTE — Progress Notes (Signed)
BP 134/88   Pulse 86   Temp 98.3 F (36.8 C) (Oral)   Wt 238 lb 12.8 oz (108.3 kg)   SpO2 97%   BMI 35.26 kg/m    Subjective:    Patient ID: Misty Reyes, female    DOB: 05/10/1981, 40 y.o.   MRN: [id number]  HPI: Misty Reyes is a 40 y.o. female  Chief Complaint  Patient presents with   Nephrolithiasis    Patient states she doesn't think she passed the stone yet, as she is still having some pain and discomfort. Patient states she follows up with the Nephrologist on 26th. Patient states she is having pressure that is pressing on her bladder and she has to use the restroom a lot.    Elevated Hemoglobin   KIDNEY STONE Was diagnosed with kidney stone on 08/23/21, followed up with PCP on 08/24/21, taking Flomax and Norco -- minimally using Norco. A 3 mm stone in mid right ureter.  Saw urology on 09/07/21 and KUB done showing possible movement of stone and they expect this to pass on own, she is to follow-up with them on 26th.  She reports ongoing pain.  Pain 3-4/10 today, improving -- to lower right abdomen to back, intermittent.  Pain is not as bad as the beginning + having frequent urination.  No N&V.  No hematuria.  She does not feel she has passed stone yet. Duration: weeks Location: Right and low back Onset: sudden Severity: 3/10 Quality: aching and throbbing Frequency: intermittent Aggravating factors: none Alleviating factors: muscle relaxer Status: fluctuating Treatments attempted: muscle relaxer Bowel / bladder incontinence:  no Fevers:  no Dysuria / urinary frequency:  a little at times  PREDIABETES In June A1c 6%.   Polydipsia/polyuria: no Visual disturbance: no Chest pain: no  Relevant past medical, surgical, family and social history reviewed and updated as indicated. Interim medical history since our last visit reviewed. Allergies and medications reviewed and updated.  Review of Systems  Constitutional:  Negative for activity change, appetite change,  diaphoresis, fatigue and fever.  Respiratory:  Negative for cough, chest tightness and shortness of breath.   Cardiovascular:  Negative for chest pain, palpitations and leg swelling.  Gastrointestinal:  Positive for abdominal pain. Negative for abdominal distention, blood in stool, constipation, diarrhea, nausea and vomiting.  Genitourinary:  Positive for dysuria.  Neurological: Negative.   Psychiatric/Behavioral: Negative.     Per HPI unless specifically indicated above     Objective:    BP 134/88   Pulse 86   Temp 98.3 F (36.8 C) (Oral)   Wt 238 lb 12.8 oz (108.3 kg)   SpO2 97%   BMI 35.26 kg/m   Wt Readings from Last 3 Encounters:  09/21/21 238 lb 12.8 oz (108.3 kg)  09/07/21 243 lb (110.2 kg)  08/23/21 243 lb 6.2 oz (110.4 kg)    Physical Exam Vitals and nursing note reviewed.  Constitutional:      General: She is awake. She is not in acute distress.    Appearance: She is well-developed and well-groomed. She is obese. She is not ill-appearing.  HENT:     Head: Normocephalic.     Right Ear: Hearing normal.     Left Ear: Hearing normal.  Eyes:     General: Lids are normal.        Right eye: No discharge.        Left eye: No discharge.     Conjunctiva/sclera: Conjunctivae normal.     Pupils: Pupils  are equal, round, and reactive to light.  Cardiovascular:     Rate and Rhythm: Normal rate and regular rhythm.     Heart sounds: Normal heart sounds. No murmur heard.   No gallop.  Pulmonary:     Effort: Pulmonary effort is normal. No accessory muscle usage or respiratory distress.     Breath sounds: Normal breath sounds.  Abdominal:     General: Bowel sounds are normal. There is no distension.     Palpations: Abdomen is soft.     Tenderness: There is abdominal tenderness in the suprapubic area. There is no right CVA tenderness or left CVA tenderness.  Musculoskeletal:     Cervical back: Normal range of motion and neck supple.     Right lower leg: No edema.     Left  lower leg: No edema.  Skin:    General: Skin is warm and dry.  Neurological:     Mental Status: She is alert and oriented to person, place, and time.  Psychiatric:        Attention and Perception: Attention normal.        Mood and Affect: Mood normal.        Behavior: Behavior normal. Behavior is cooperative.        Thought Content: Thought content normal.        Judgment: Judgment normal.    Results for orders placed or performed in visit on 09/07/21  Microscopic Examination   Urine  Result Value Ref Range   WBC, UA 6-10 (A) 0 - 5 /hpf   RBC 0-2 0 - 2 /hpf   Epithelial Cells (non renal) >10 (A) 0 - 10 /hpf   Mucus, UA Present (A) Not Estab.   Bacteria, UA Many (A) None seen/Few  Urinalysis, Complete  Result Value Ref Range   Specific Gravity, UA >1.030 (H) 1.005 - 1.030   pH, UA 5.5 5.0 - 7.5   Color, UA Yellow Yellow   Appearance Ur Cloudy (A) Clear   Leukocytes,UA Negative Negative   Protein,UA 1+ (A) Negative/Trace   Glucose, UA Negative Negative   Ketones, UA Negative Negative   RBC, UA Trace (A) Negative   Bilirubin, UA Negative Negative   Urobilinogen, Ur 0.2 0.2 - 1.0 mg/dL   Nitrite, UA Negative Negative   Microscopic Examination See below:       Assessment & Plan:   Problem List Items Addressed This Visit       Genitourinary   Kidney stone - Primary    Acute, noted in ER on 08/23/21 -- 3 mm right side.  Followed by urology at this time who reported on recent imaging it is suspected stone will pass soon.  Continue all current medications at this time.  Check UA due to some urinary symptoms to ensure no infection, if present will treat accordingly.  Return in 4 weeks.      Relevant Orders   Urinalysis, Routine w reflex microscopic     Other   Obesity    BMI 35.26.  Recommended eating smaller high protein, low fat meals more frequently and exercising 30 mins a day 5 times a week with a goal of 10-15lb weight loss in the next 3 months. Patient voiced their  understanding and motivation to adhere to these recommendations.       Elevated hemoglobin A1c    Recent trend up to 6%, will recheck A1c and CMP today.  Educated patient and recommend continued focus on diet and regular activity.  Relevant Orders   Bayer DCA Hb A1c Waived   Comprehensive metabolic panel   Other Visit Diagnoses     Flu vaccine need       Flu vaccine today   Relevant Orders   Flu Vaccine QUAD 6+ mos PF IM (Fluarix Quad PF) (Completed)        Follow up plan: Return in about 4 weeks (around 10/19/2021) for Kidney stone check.

## 2021-09-21 NOTE — Assessment & Plan Note (Signed)
Acute, noted in ER on 08/23/21 -- 3 mm right side.  Followed by urology at this time who reported on recent imaging it is suspected stone will pass soon.  Continue all current medications at this time.  Check UA due to some urinary symptoms to ensure no infection, if present will treat accordingly.  Return in 4 weeks.

## 2021-09-21 NOTE — Assessment & Plan Note (Addendum)
Recent trend up to 6%, will recheck A1c and CMP today.  Educated patient and recommend continued focus on diet and regular activity.

## 2021-09-21 NOTE — Progress Notes (Signed)
Contacted via MyChart   Good afternoon Misty Reyes -- your A1c which was prediabetic at 6% last check is now 5.5% and no longer in prediabetic stage!!  Keep it up!!  I have sent urine for culture and we will see if infection shows.  I will let you know:) Keep being amazing!!  Thank you for allowing me to participate in your care.  I appreciate you. Kindest regards, Gedalya Jim

## 2021-09-21 NOTE — Addendum Note (Signed)
Addended by: Aura Dials T on: 09/21/2021 03:50 PM   Modules accepted: Orders

## 2021-09-21 NOTE — Assessment & Plan Note (Signed)
BMI 35.26.  Recommended eating smaller high protein, low fat meals more frequently and exercising 30 mins a day 5 times a week with a goal of 10-15lb weight loss in the next 3 months. Patient voiced their understanding and motivation to adhere to these recommendations.

## 2021-09-21 NOTE — Patient Instructions (Signed)
Flank Pain, Adult ?Flank pain is pain in your side. The flank is the area on your side between your upper belly (abdomen) and your spine. The pain may occur over a short time (acute), or it may be long-term or come back often (chronic). It may be mild or very bad. Pain in this area can be caused by many different things. ?Follow these instructions at home: ? ?Drink enough fluid to keep your pee (urine) pale yellow. ?Rest as told by your doctor. ?Take over-the-counter and prescription medicines only as told by your doctor. ?Keep a journal to keep track of: ?What has caused your flank pain. ?What has made your flank pain feel better. ?Keep all follow-up visits. ?Contact a doctor if: ?Medicine does not help your pain. ?You have new symptoms. ?Your pain gets worse. ?Your symptoms last longer than 2-3 days. ?You have trouble peeing. ?You are peeing more often than normal. ?Get help right away if: ?You have trouble breathing. ?You are short of breath. ?Your belly hurts, or it is swollen or red. ?You feel like you may vomit (nauseous). ?You vomit. ?You feel faint, or you faint. ?You have blood in your pee. ?You have flank pain and a fever. ?These symptoms may be an emergency. Get help right away. Call your local emergency services (911 in the [place] ?Do not wait to see if the symptoms will go away. ?Do not drive yourself to the hospital. ?Summary ?Flank pain is pain in your side. The flank is the area of your side between your upper belly (abdomen) and your spine. ?Flank pain may occur over a short time (acute), or it may be long-term or come back often (chronic). It may be mild or very bad. ?Pain in this area can be caused by many different things. ?Contact your doctor if your symptoms get worse or last longer than 2-3 days. ?This information is not intended to replace advice given to you by your health care provider. Make sure you discuss any questions you have with your health care provider. ?Document Revised:  02/05/2021 Document Reviewed: 02/05/2021 ?Elsevier Patient Education ? 2022 Elsevier Inc. ? ?

## 2021-09-22 LAB — COMPREHENSIVE METABOLIC PANEL
ALT: 31 IU/L (ref 0–32)
AST: 22 IU/L (ref 0–40)
Albumin/Globulin Ratio: 1.5 (ref 1.2–2.2)
Albumin: 4.4 g/dL (ref 3.8–4.8)
Alkaline Phosphatase: 63 IU/L (ref 44–121)
BUN/Creatinine Ratio: 13 (ref 9–23)
BUN: 9 mg/dL (ref 6–24)
Bilirubin Total: 0.6 mg/dL (ref 0.0–1.2)
CO2: 21 mmol/L (ref 20–29)
Calcium: 9 mg/dL (ref 8.7–10.2)
Chloride: 103 mmol/L (ref 96–106)
Creatinine, Ser: 0.71 mg/dL (ref 0.57–1.00)
Globulin, Total: 3 g/dL (ref 1.5–4.5)
Glucose: 88 mg/dL (ref 70–99)
Potassium: 4.1 mmol/L (ref 3.5–5.2)
Sodium: 138 mmol/L (ref 134–144)
Total Protein: 7.4 g/dL (ref 6.0–8.5)
eGFR: 110 mL/min/{1.73_m2} (ref >59)

## 2021-09-23 NOTE — Progress Notes (Signed)
Contacted via MyChart   Good afternoon Elaya, your labs have returned and remain nice and normal.  No changes needed.  Hope you are feeling well and that stone has passed.  Big hugs!! Keep being awesome!!  Thank you for allowing me to participate in your care.  I appreciate you. Kindest regards, Jennamarie Goings

## 2021-09-25 LAB — URINE CULTURE

## 2021-10-01 ENCOUNTER — Other Ambulatory Visit: Payer: Self-pay | Admitting: Urology

## 2021-10-01 DIAGNOSIS — N201 Calculus of ureter: Secondary | ICD-10-CM

## 2021-10-03 ENCOUNTER — Encounter: Payer: Self-pay | Admitting: Urology

## 2021-10-03 ENCOUNTER — Ambulatory Visit (INDEPENDENT_AMBULATORY_CARE_PROVIDER_SITE_OTHER): Payer: BC Managed Care – PPO | Admitting: Urology

## 2021-10-03 ENCOUNTER — Ambulatory Visit
Admission: RE | Admit: 2021-10-03 | Discharge: 2021-10-03 | Disposition: A | Discharge status: Home / Self Care | Payer: BC Managed Care – PPO | Source: Ambulatory Visit | Attending: Urology | Admitting: Urology

## 2021-10-03 ENCOUNTER — Other Ambulatory Visit: Payer: Self-pay

## 2021-10-03 ENCOUNTER — Ambulatory Visit
Admission: RE | Admit: 2021-10-03 | Discharge: 2021-10-03 | Disposition: A | Discharge status: Home / Self Care | Payer: BC Managed Care – PPO | Attending: Urology | Admitting: Urology

## 2021-10-03 VITALS — BP 134/85 | HR 98 | Ht 69.0 in | Wt 235.0 lb

## 2021-10-03 DIAGNOSIS — N201 Calculus of ureter: Secondary | ICD-10-CM

## 2021-10-03 NOTE — Progress Notes (Signed)
   10/03/2021 3:34 PM   Misty Reyes 02/13/1981 [id number]  Referring provider: Venita Lick, NP [address] [place],  [place] [address]  Chief Complaint  Patient presents with   Nephrolithiasis    HPI: 40 y.o. female presents for follow-up visit.  Initially seen 09/07/2021 after an ED visit for renal colic CT showed a 3 mm right ureteral calculus and she elected MET KUB performed the day of her visit showed that the stone had progressed to the distal ureter Saw her PCP 10/14 with complaints of frequency, urgency, voiding small amounts consistent with stone at the distal ureter The following day she had intense urge/pain and sudden resolution of symptoms She has been asymptomatic since that time  PMH: Past Medical History:  Diagnosis Date   Obesity (BMI 30.0-34.9)    PCOS (polycystic ovarian syndrome)     Surgical History: No past surgical history on file.  Home Medications:  Allergies as of 10/03/2021   No Known Allergies      Medication List        Accurate as of October 03, 2021  3:34 PM. If you have any questions, ask your nurse or doctor.          HYDROcodone-acetaminophen 5-325 MG tablet Commonly known as: Norco Take 1 tablet by mouth every 6 (six) hours as needed for moderate pain.   ondansetron 4 MG disintegrating tablet Commonly known as: Zofran ODT Take 1 tablet (4 mg total) by mouth every 8 (eight) hours as needed for nausea or vomiting.   tamsulosin 0.4 MG Caps capsule Commonly known as: FLOMAX TAKE 1 CAPSULE BY MOUTH EVERY DAY   triamcinolone ointment 0.1 % Commonly known as: KENALOG Apply 1 application topically 2 (two) times daily.        Allergies: No Known Allergies  Family History: Family History  Problem Relation Age of Onset   Diabetes Paternal Grandmother    Breast cancer Neg Hx    Ovarian cancer Neg Hx     Social History:  reports that she has never smoked. She has never used smokeless tobacco. She reports that she  does not drink alcohol and does not use drugs.   Physical Exam: BP 134/85   Pulse 98   Ht $R'5\' 9"'pf$  (1.753 m)   Wt 235 lb (106.6 kg)   BMI 34.70 kg/m   Constitutional:  Alert and oriented, No acute distress. HEENT: [place] AT, moist mucus membranes.  Trachea midline, no masses. Cardiovascular: No clubbing, cyanosis, or edema. Respiratory: Normal respiratory effort, no increased work of breathing. Psychiatric: Normal mood and affect.   Pertinent Imaging: KUB performed today was reviewed and personally interpreted.  The previously identified 3 mm calculus in the right true bony pelvis is no longer present   Assessment & Plan:    1.  Passed ureteral calculus First-time stone former and CT showed no additional calculi We discussed she has a 50% chance of forming another stone within the next 5 years We discussed general stone prevention guidelines including increasing water intake to keep urine output >2.5 L/day, dietary oxalate moderation, dietary sodium restriction and the use of citrus beverages to raise urinary citrate levels Follow-up 1 year with KUB instructed to call earlier for recurrent stone symptoms   Abbie Sons, MD  [facility] [address] [place], [place] [address] [phone]

## 2021-10-04 ENCOUNTER — Encounter: Payer: Self-pay | Admitting: Urology

## 2021-10-05 ENCOUNTER — Encounter: Payer: Self-pay | Admitting: Urology

## 2021-10-19 ENCOUNTER — Telehealth (INDEPENDENT_AMBULATORY_CARE_PROVIDER_SITE_OTHER): Payer: BC Managed Care – PPO | Admitting: Nurse Practitioner

## 2021-10-19 ENCOUNTER — Encounter: Payer: Self-pay | Admitting: Nurse Practitioner

## 2021-10-19 DIAGNOSIS — N200 Calculus of kidney: Secondary | ICD-10-CM

## 2021-10-19 NOTE — Patient Instructions (Signed)
Low-Purine Eating Plan A low-purine eating plan involves making food choices to limit your intake of purine. Purine is a kind of uric acid. Too much uric acid in your blood can cause certain conditions, such as gout and kidney stones. Eating a low-purinediet can help control these conditions. What are tips for following this plan? Reading food labels Avoid foods with saturated or Trans fat. Check the ingredient list of grains-based foods, such as bread and cereal, to make sure that they contain whole grains. Check the ingredient list of sauces or soups to make sure they do not contain meat or fish. When choosing soft drinks, check the ingredient list to make sure they do not contain high-fructose corn syrup. Shopping  Buy plenty of fresh fruits and vegetables. Avoid buying canned or fresh fish. Buy dairy products labeled as low-fat or nonfat. Avoid buying premade or processed foods. These foods are often high in fat, salt (sodium), and added sugar.  Cooking Use olive oil instead of butter when cooking. Oils like olive oil, canola oil, and sunflower oil contain healthy fats. Meal planning Learn which foods do or do not affect you. If you find out that a food tends to cause your gout symptoms to flare up, avoid eating that food. You can enjoy foods that do not cause problems. If you have any questions about a food item, talk with your dietitian or health care provider. Limit foods high in fat, especially saturated fat. Fat makes it harder for your body to get rid of uric acid. Choose foods that are lower in fat and are lean sources of protein. General guidelines Limit alcohol intake to no more than 1 drink a day for nonpregnant women and 2 drinks a day for men. One drink equals 12 oz of beer, 5 oz of wine, or 1 oz of hard liquor. Alcohol can affect the way your body gets rid of uric acid. Drink plenty of water to keep your urine clear or pale yellow. Fluids can help remove uric acid from your  body. If directed by your health care provider, take a vitamin C supplement. Work with your health care provider and dietitian to develop a plan to achieve or maintain a healthy weight. Losing weight can help reduce uric acid in your blood. What foods are recommended? The items listed may not be a complete list. Talk with your dietitian aboutwhat dietary choices are best for you. Foods low in purines Foods low in purines do not need to be limited. These include: All fruits. All low-purine vegetables, pickles, and olives. Breads, pasta, rice, cornbread, and popcorn. Cake and other baked goods. All dairy foods. Eggs, nuts, and nut butters. Spices and condiments, such as salt, herbs, and vinegar. Plant oils, butter, and margarine. Water, sugar-free soft drinks, tea, coffee, and cocoa. Vegetable-based soups, broths, sauces, and gravies. Foods moderate in purines Foods moderate in purines should be limited to the amounts listed.  cup of asparagus, cauliflower, spinach, mushrooms, or green peas, each day. 2/3 cup uncooked oatmeal, each day.  cup dry wheat bran or wheat germ, each day. 2-3 ounces of meat or poultry, each day. 4-6 ounces of shellfish, such as crab, lobster, oysters, or shrimp, each day. 1 cup cooked beans, peas, or lentils, each day. Soup, broths, or bouillon made from meat or fish. Limit these foods as much as possible. What foods are not recommended? The items listed may not be a complete list. Talk with your dietitian aboutwhat dietary choices are best for you.   Limit your intake of foods high in purines, including: Beer and other alcohol. Meat-based gravy or sauce. Canned or fresh fish, such as: Anchovies, sardines, herring, and tuna. Mussels and scallops. Codfish, trout, and haddock. Bacon. Organ meats, such as: Liver or kidney. Tripe. Sweetbreads (thymus gland or pancreas). Wild game or goose. Yeast or yeast extract supplements. Drinks sweetened with  high-fructose corn syrup. Summary Eating a low-purine diet can help control conditions caused by too much uric acid in the body, such as gout or kidney stones. Choose low-purine foods, limit alcohol, and limit foods high in fat. You will learn over time which foods do or do not affect you. If you find out that a food tends to cause your gout symptoms to flare up, avoid eating that food. This information is not intended to replace advice given to you by your health care provider. Make sure you discuss any questions you have with your healthcare provider. Document Revised: 03/09/2020 Document Reviewed: 03/09/2020 Elsevier Patient Education  2022 [employer]  

## 2021-10-19 NOTE — Progress Notes (Signed)
LMP 10/03/2021 (Exact Date)    Subjective:    Patient ID: Misty Reyes, female    DOB: 09/17/1981, 40 y.o.   MRN: [id number]  HPI: Misty Reyes is a 40 y.o. female  Chief Complaint  Patient presents with   Kidney Stone     Patient is here to follow up kidney stone. Patient states she is feeling better and she passed the stone and there is no more stones. Patient states she had a follow up appointment with Urology on 10/03/21 and they performed an x-ray and patient states she was informed there were no more stones.    This visit was completed via video visit through MyChart due to the restrictions of the COVID-19 pandemic. All issues as above were discussed and addressed. Physical exam was done as above through visual confirmation on video through MyChart. If it was felt that the patient should be evaluated in the office, they were directed there. The patient verbally consented to this visit. Location of the patient: home Location of the provider: work Those involved with this call:  Provider: Marnee Guarneri, DNP CMA: Irena Reichmann, [place] Desk/Registration: [employer]  Time spent on call:  21 minutes with patient face to face via video conference. More than 50% of this time was spent in counseling and coordination of care. 15 minutes total spent in review of patient's record and preparation of their chart.  I verified patient identity using two factors (patient name and date of birth). Patient consents verbally to being seen via telemedicine visit today.    KIDNEY STONE Was diagnosed with kidney stone on 08/23/21, she has now passed stone and overall is feeling better.  Saw urology on 10/03/21 for follow-up and imaging done that showed no further stone. She has no further symptoms.   Duration: improved Location: none Onset: sudden Severity: 0/10 Quality: none Frequency: none Aggravating factors: none Alleviating factors: muscle relaxer Status: improved Treatments  attempted: muscle relaxer Bowel / bladder incontinence:  no Fevers:  no Dysuria / urinary frequency:  none  Relevant past medical, surgical, family and social history reviewed and updated as indicated. Interim medical history since our last visit reviewed. Allergies and medications reviewed and updated.  Review of Systems  Constitutional:  Negative for activity change, appetite change, diaphoresis, fatigue and fever.  Respiratory:  Negative for cough, chest tightness and shortness of breath.   Cardiovascular:  Negative for chest pain, palpitations and leg swelling.  Gastrointestinal: Negative.   Genitourinary: Negative.   Neurological: Negative.   Psychiatric/Behavioral: Negative.     Per HPI unless specifically indicated above     Objective:    LMP 10/03/2021 (Exact Date)   Wt Readings from Last 3 Encounters:  10/03/21 235 lb (106.6 kg)  09/21/21 238 lb 12.8 oz (108.3 kg)  09/07/21 243 lb (110.2 kg)    Physical Exam Vitals and nursing note reviewed.  Constitutional:      General: She is awake. She is not in acute distress.    Appearance: She is well-developed and well-groomed. She is obese. She is not ill-appearing or toxic-appearing.  HENT:     Head: Normocephalic.     Right Ear: Hearing normal.     Left Ear: Hearing normal.  Eyes:     General: Lids are normal.        Right eye: No discharge.        Left eye: No discharge.     Conjunctiva/sclera: Conjunctivae normal.  Pulmonary:     Effort:  Pulmonary effort is normal. No accessory muscle usage or respiratory distress.  Musculoskeletal:     Cervical back: Normal range of motion.  Neurological:     Mental Status: She is alert and oriented to person, place, and time.  Psychiatric:        Attention and Perception: Attention normal.        Mood and Affect: Mood normal.        Behavior: Behavior normal. Behavior is cooperative.        Thought Content: Thought content normal.        Judgment: Judgment normal.     Results for orders placed or performed in visit on 09/21/21  Urine Culture   Specimen: Urine   UR  Result Value Ref Range   Urine Culture, Routine Final report    Organism ID, Bacteria Comment   Microscopic Examination   Urine  Result Value Ref Range   WBC, UA 0-5 0 - 5 /hpf   RBC 0-2 0 - 2 /hpf   Epithelial Cells (non renal) 0-10 0 - 10 /hpf   Mucus, UA Present (A) Not Estab.   Bacteria, UA Few (A) None seen/Few  Bayer DCA Hb A1c Waived  Result Value Ref Range   HB A1C (BAYER DCA - WAIVED) 5.5 4.8 - 5.6 %  Comprehensive metabolic panel  Result Value Ref Range   Glucose 88 70 - 99 mg/dL   BUN 9 6 - 24 mg/dL   Creatinine, Ser 0.71 0.57 - 1.00 mg/dL   eGFR 110 >59 mL/min/1.73   BUN/Creatinine Ratio 13 9 - 23   Sodium 138 134 - 144 mmol/L   Potassium 4.1 3.5 - 5.2 mmol/L   Chloride 103 96 - 106 mmol/L   CO2 21 20 - 29 mmol/L   Calcium 9.0 8.7 - 10.2 mg/dL   Total Protein 7.4 6.0 - 8.5 g/dL   Albumin 4.4 3.8 - 4.8 g/dL   Globulin, Total 3.0 1.5 - 4.5 g/dL   Albumin/Globulin Ratio 1.5 1.2 - 2.2   Bilirubin Total 0.6 0.0 - 1.2 mg/dL   Alkaline Phosphatase 63 44 - 121 IU/L   AST 22 0 - 40 IU/L   ALT 31 0 - 32 IU/L  Urinalysis, Routine w reflex microscopic  Result Value Ref Range   Specific Gravity, UA 1.025 1.005 - 1.030   pH, UA 5.5 5.0 - 7.5   Color, UA Yellow Yellow   Appearance Ur Cloudy (A) Clear   Leukocytes,UA Trace (A) Negative   Protein,UA Negative Negative/Trace   Glucose, UA Negative Negative   Ketones, UA Negative Negative   RBC, UA Trace (A) Negative   Bilirubin, UA Negative Negative   Urobilinogen, Ur 0.2 0.2 - 1.0 mg/dL   Nitrite, UA Negative Negative   Microscopic Examination See below:       Assessment & Plan:   Problem List Items Addressed This Visit       Genitourinary   Kidney stone    Improved at this time, no further stone.  She has been educated on diet changes and foods/drinks to avoid to prevent stones.  Will return to office as  scheduled in December for regular follow-up.       I discussed the assessment and treatment plan with the patient. The patient was provided an opportunity to ask questions and all were answered. The patient agreed with the plan and demonstrated an understanding of the instructions.   The patient was advised to call back or seek an in-person  evaluation if the symptoms worsen or if the condition fails to improve as anticipated.   I provided 21+ minutes of time during this encounter.   Follow up plan: Return for as scheduled in December.

## 2021-10-19 NOTE — Assessment & Plan Note (Signed)
Improved at this time, no further stone.  She has been educated on diet changes and foods/drinks to avoid to prevent stones.  Will return to office as scheduled in December for regular follow-up.

## 2021-11-01 ENCOUNTER — Other Ambulatory Visit: Payer: Self-pay | Admitting: Urology

## 2021-11-01 DIAGNOSIS — N201 Calculus of ureter: Secondary | ICD-10-CM

## 2021-11-12 ENCOUNTER — Ambulatory Visit: Payer: BC Managed Care – PPO | Admitting: Dermatology

## 2021-11-25 ENCOUNTER — Encounter: Payer: Self-pay | Admitting: Nurse Practitioner

## 2021-11-28 ENCOUNTER — Other Ambulatory Visit: Payer: Self-pay

## 2021-11-28 ENCOUNTER — Ambulatory Visit: Payer: BC Managed Care – PPO | Admitting: Nurse Practitioner

## 2021-11-28 ENCOUNTER — Encounter: Payer: Self-pay | Admitting: Nurse Practitioner

## 2021-11-28 VITALS — BP 127/89 | HR 80 | Temp 98.4°F | Ht 69.0 in | Wt 234.4 lb

## 2021-11-28 DIAGNOSIS — E6609 Other obesity due to excess calories: Secondary | ICD-10-CM

## 2021-11-28 DIAGNOSIS — Z6834 Body mass index (BMI) 34.0-34.9, adult: Secondary | ICD-10-CM | POA: Diagnosis not present

## 2021-11-28 NOTE — Assessment & Plan Note (Signed)
BMI 34.61 with weight loss continuing, she is make changes in lifestyle.  Praised for this.  Recommended eating smaller high protein, low fat meals more frequently and exercising 30 mins a day 5 times a week with a goal of 10-15lb weight loss in the next 3 months. Patient voiced their understanding and motivation to adhere to these recommendations.

## 2021-11-28 NOTE — Patient Instructions (Signed)
Healthy Eating °Following a healthy eating pattern may help you to achieve and maintain a healthy body weight, reduce the risk of chronic disease, and live a long and productive life. It is important to follow a healthy eating pattern at an appropriate calorie level for your body. Your nutritional needs should be met primarily through food by choosing a variety of nutrient-rich foods. °What are tips for following this plan? °Reading food labels °Read labels and choose the following: °Reduced or low sodium. °Juices with 100% fruit juice. °Foods with low saturated fats and high polyunsaturated and monounsaturated fats. °Foods with whole grains, such as whole wheat, cracked wheat, brown rice, and wild rice. °Whole grains that are fortified with folic acid. This is recommended for women who are pregnant or who want to become pregnant. °Read labels and avoid the following: °Foods with a lot of added sugars. These include foods that contain brown sugar, corn sweetener, corn syrup, dextrose, fructose, glucose, high-fructose corn syrup, honey, invert sugar, lactose, malt syrup, maltose, molasses, raw sugar, sucrose, trehalose, or turbinado sugar. °Do not eat more than the following amounts of added sugar per day: °6 teaspoons (25 g) for women. °9 teaspoons (38 g) for men. °Foods that contain processed or refined starches and grains. °Refined grain products, such as white flour, degermed cornmeal, white bread, and white rice. °Shopping °Choose nutrient-rich snacks, such as vegetables, whole fruits, and nuts. Avoid high-calorie and high-sugar snacks, such as potato chips, fruit snacks, and candy. °Use oil-based dressings and spreads on foods instead of solid fats such as butter, stick margarine, or cream cheese. °Limit pre-made sauces, mixes, and "instant" products such as flavored rice, instant noodles, and ready-made pasta. °Try more plant-protein sources, such as tofu, tempeh, black beans, edamame, lentils, nuts, and  seeds. °Explore eating plans such as the Mediterranean diet or vegetarian diet. °Cooking °Use oil to sauté or stir-fry foods instead of solid fats such as butter, stick margarine, or lard. °Try baking, boiling, grilling, or broiling instead of frying. °Remove the fatty part of meats before cooking. °Steam vegetables in water or broth. °Meal planning ° °At meals, imagine dividing your plate into fourths: °One-half of your plate is fruits and vegetables. °One-fourth of your plate is whole grains. °One-fourth of your plate is protein, especially lean meats, poultry, eggs, tofu, beans, or nuts. °Include low-fat dairy as part of your daily diet. °Lifestyle °Choose healthy options in all settings, including home, work, school, restaurants, or stores. °Prepare your food safely: °Wash your hands after handling raw meats. °Keep food preparation surfaces clean by regularly washing with hot, soapy water. °Keep raw meats separate from ready-to-eat foods, such as fruits and vegetables. °Cook seafood, meat, poultry, and eggs to the recommended internal temperature. °Store foods at safe temperatures. In general: °Keep cold foods at 40°F (4.4°C) or below. °Keep hot foods at 140°F (60°C) or above. °Keep your freezer at 0°F (-17.8°C) or below. °Foods are no longer safe to eat when they have been between the temperatures of 40°-140°F (4.4-60°C) for more than 2 hours. °What foods should I eat? °Fruits °Aim to eat 2 cup-equivalents of fresh, canned (in natural juice), or frozen fruits each day. Examples of 1 cup-equivalent of fruit include 1 small apple, 8 large strawberries, 1 cup canned fruit, ½ cup dried fruit, or 1 cup 100% juice. °Vegetables °Aim to eat 2½-3 cup-equivalents of fresh and frozen vegetables each day, including different varieties and colors. Examples of 1 cup-equivalent of vegetables include 2 medium carrots, 2 cups raw,   leafy greens, 1 cup chopped vegetable (raw or cooked), or 1 medium baked potato. °Grains °Aim to  eat 6 ounce-equivalents of whole grains each day. Examples of 1 ounce-equivalent of grains include 1 slice of bread, 1 cup ready-to-eat cereal, 3 cups popcorn, or ½ cup cooked rice, pasta, or cereal. °Meats and other proteins °Aim to eat 5-6 ounce-equivalents of protein each day. Examples of 1 ounce-equivalent of protein include 1 egg, 1/2 cup nuts or seeds, or 1 tablespoon (16 g) peanut butter. A cut of meat or fish that is the size of a deck of cards is about 3-4 ounce-equivalents. °Of the protein you eat each week, try to have at least 8 ounces come from seafood. This includes salmon, trout, herring, and anchovies. °Dairy °Aim to eat 3 cup-equivalents of fat-free or low-fat dairy each day. Examples of 1 cup-equivalent of dairy include 1 cup (240 mL) milk, 8 ounces (250 g) yogurt, 1½ ounces (44 g) natural cheese, or 1 cup (240 mL) fortified soy milk. °Fats and oils °Aim for about 5 teaspoons (21 g) per day. Choose monounsaturated fats, such as canola and olive oils, avocados, peanut butter, and most nuts, or polyunsaturated fats, such as sunflower, corn, and soybean oils, walnuts, pine nuts, sesame seeds, sunflower seeds, and flaxseed. °Beverages °Aim for six 8-oz glasses of water per day. Limit coffee to three to five 8-oz cups per day. °Limit caffeinated beverages that have added calories, such as soda and energy drinks. °Limit alcohol intake to no more than 1 drink a day for nonpregnant women and 2 drinks a day for men. One drink equals 12 oz of beer (355 mL), 5 oz of wine (148 mL), or 1½ oz of hard liquor (44 mL). °Seasoning and other foods °Avoid adding excess amounts of salt to your foods. Try flavoring foods with herbs and spices instead of salt. °Avoid adding sugar to foods. °Try using oil-based dressings, sauces, and spreads instead of solid fats. °This information is based on general [place] nutrition guidelines. For more information, visit choosemyplate.gov. Exact amounts may vary based on your nutrition  needs. °Summary °A healthy eating plan may help you to maintain a healthy weight, reduce the risk of chronic diseases, and stay active throughout your life. °Plan your meals. Make sure you eat the right portions of a variety of nutrient-rich foods. °Try baking, boiling, grilling, or broiling instead of frying. °Choose healthy options in all settings, including home, work, school, restaurants, or stores. °This information is not intended to replace advice given to you by your health care provider. Make sure you discuss any questions you have with your health care provider. °Document Revised: 07/24/2021 Document Reviewed: 07/24/2021 °Elsevier Patient Education © 2022 [employer] Inc. ° °

## 2021-11-28 NOTE — Progress Notes (Signed)
BP 127/89    Pulse 80    Temp 98.4 F (36.9 C)    Ht _0  (1.753 m)    Wt 234 lb 6.4 oz (106.3 kg)    SpO2 97%    BMI 34.61 kg/m    Subjective:    Patient ID: Misty Reyes, female    DOB: [birth year], 39 y.o.   MRN: [id number]  HPI: Misty Reyes is a 40 y.o. female  Chief Complaint  Patient presents with   Weight Check    Patient is here for a weight check. Patient denies having any concerns at today's visit.     WEIGHT CHECK: Presents today for weight check, has lost 4 pounds since 09/21/21.  Has been changing diet and exercising at home.  Drinking plenty of water at home + sticking to local market.  Currently is exercising 4 days a week (Thursday, Friday, Saturday, Sunday) -- exercises for one hour.  Has cut back on bread products, added in more vegetables.    Relevant past medical, surgical, family and social history reviewed and updated as indicated. Interim medical history since our last visit reviewed. Allergies and medications reviewed and updated.  Review of Systems  Constitutional:  Negative for activity change, appetite change, diaphoresis, fatigue and fever.  Respiratory:  Negative for cough, chest tightness and shortness of breath.   Cardiovascular:  Negative for chest pain, palpitations and leg swelling.  Gastrointestinal: Negative.   Neurological: Negative.   Psychiatric/Behavioral: Negative.     Per HPI unless specifically indicated above     Objective:    BP 127/89    Pulse 80    Temp 98.4 F (36.9 C)    Ht _1  (1.753 m)    Wt 234 lb 6.4 oz (106.3 kg)    SpO2 97%    BMI 34.61 kg/m   Wt Readings from Last 3 Encounters:  11/28/21 234 lb 6.4 oz (106.3 kg)  10/03/21 235 lb (106.6 kg)  09/21/21 238 lb 12.8 oz (108.3 kg)    Physical Exam Vitals and nursing note reviewed.  Constitutional:      General: She is awake. She is not in acute distress.    Appearance: She is well-developed and well-groomed. She is obese. She is not ill-appearing.  HENT:      Head: Normocephalic.     Right Ear: Hearing normal.     Left Ear: Hearing normal.  Eyes:     General: Lids are normal.        Right eye: No discharge.        Left eye: No discharge.     Conjunctiva/sclera: Conjunctivae normal.     Pupils: Pupils are equal, round, and reactive to light.  Cardiovascular:     Rate and Rhythm: Normal rate and regular rhythm.     Heart sounds: Normal heart sounds. No murmur heard.   No gallop.  Pulmonary:     Effort: Pulmonary effort is normal. No accessory muscle usage or respiratory distress.     Breath sounds: Normal breath sounds.  Abdominal:     General: Bowel sounds are normal.     Palpations: Abdomen is soft.  Musculoskeletal:     Cervical back: Normal range of motion and neck supple.     Right lower leg: No edema.     Left lower leg: No edema.  Skin:    General: Skin is warm and dry.  Neurological:     Mental Status: She is alert and oriented to  person, place, and time.  Psychiatric:        Attention and Perception: Attention normal.        Mood and Affect: Mood normal.        Behavior: Behavior normal. Behavior is cooperative.        Thought Content: Thought content normal.        Judgment: Judgment normal.    Results for orders placed or performed in visit on 09/21/21  Urine Culture   Specimen: Urine   UR  Result Value Ref Range   Urine Culture, Routine Final report    Organism ID, Bacteria Comment   Microscopic Examination   Urine  Result Value Ref Range   WBC, UA 0-5 0 - 5 /hpf   RBC 0-2 0 - 2 /hpf   Epithelial Cells (non renal) 0-10 0 - 10 /hpf   Mucus, UA Present (A) Not Estab.   Bacteria, UA Few (A) None seen/Few  Bayer DCA Hb A1c Waived  Result Value Ref Range   HB A1C (BAYER DCA - WAIVED) 5.5 4.8 - 5.6 %  Comprehensive metabolic panel  Result Value Ref Range   Glucose 88 70 - 99 mg/dL   BUN 9 6 - 24 mg/dL   Creatinine, Ser 0.71 0.57 - 1.00 mg/dL   eGFR 110 >59 mL/min/1.73   BUN/Creatinine Ratio 13 9 - 23    Sodium 138 134 - 144 mmol/L   Potassium 4.1 3.5 - 5.2 mmol/L   Chloride 103 96 - 106 mmol/L   CO2 21 20 - 29 mmol/L   Calcium 9.0 8.7 - 10.2 mg/dL   Total Protein 7.4 6.0 - 8.5 g/dL   Albumin 4.4 3.8 - 4.8 g/dL   Globulin, Total 3.0 1.5 - 4.5 g/dL   Albumin/Globulin Ratio 1.5 1.2 - 2.2   Bilirubin Total 0.6 0.0 - 1.2 mg/dL   Alkaline Phosphatase 63 44 - 121 IU/L   AST 22 0 - 40 IU/L   ALT 31 0 - 32 IU/L  Urinalysis, Routine w reflex microscopic  Result Value Ref Range   Specific Gravity, UA 1.025 1.005 - 1.030   pH, UA 5.5 5.0 - 7.5   Color, UA Yellow Yellow   Appearance Ur Cloudy (A) Clear   Leukocytes,UA Trace (A) Negative   Protein,UA Negative Negative/Trace   Glucose, UA Negative Negative   Ketones, UA Negative Negative   RBC, UA Trace (A) Negative   Bilirubin, UA Negative Negative   Urobilinogen, Ur 0.2 0.2 - 1.0 mg/dL   Nitrite, UA Negative Negative   Microscopic Examination See below:       Assessment & Plan:   Problem List Items Addressed This Visit       Other   Obesity - Primary    BMI 34.61 with weight loss continuing, she is make changes in lifestyle.  Praised for this.  Recommended eating smaller high protein, low fat meals more frequently and exercising 30 mins a day 5 times a week with a goal of 10-15lb weight loss in the next 3 months. Patient voiced their understanding and motivation to adhere to these recommendations.         Follow up plan: Return in about 6 months (around 06/03/2022) for Annual physical.

## 2022-05-31 ENCOUNTER — Encounter: Payer: BC Managed Care – PPO | Admitting: Nurse Practitioner

## 2022-05-31 DIAGNOSIS — E282 Polycystic ovarian syndrome: Secondary | ICD-10-CM

## 2022-05-31 DIAGNOSIS — E6609 Other obesity due to excess calories: Secondary | ICD-10-CM

## 2022-05-31 DIAGNOSIS — R7309 Other abnormal glucose: Secondary | ICD-10-CM

## 2022-05-31 DIAGNOSIS — E7800 Pure hypercholesterolemia, unspecified: Secondary | ICD-10-CM

## 2022-05-31 DIAGNOSIS — Z0000 Encounter for general adult medical examination without abnormal findings: Secondary | ICD-10-CM

## 2022-05-31 DIAGNOSIS — E559 Vitamin D deficiency, unspecified: Secondary | ICD-10-CM

## 2022-05-31 DIAGNOSIS — E049 Nontoxic goiter, unspecified: Secondary | ICD-10-CM

## 2022-06-23 NOTE — Patient Instructions (Signed)

## 2022-06-28 ENCOUNTER — Ambulatory Visit (INDEPENDENT_AMBULATORY_CARE_PROVIDER_SITE_OTHER): Payer: BC Managed Care – PPO | Admitting: Nurse Practitioner

## 2022-06-28 ENCOUNTER — Encounter: Payer: Self-pay | Admitting: Nurse Practitioner

## 2022-06-28 VITALS — BP 131/89 | HR 69 | Temp 98.1°F | Ht 69.0 in | Wt 239.4 lb

## 2022-06-28 DIAGNOSIS — L439 Lichen planus, unspecified: Secondary | ICD-10-CM

## 2022-06-28 DIAGNOSIS — E559 Vitamin D deficiency, unspecified: Secondary | ICD-10-CM | POA: Diagnosis not present

## 2022-06-28 DIAGNOSIS — R7309 Other abnormal glucose: Secondary | ICD-10-CM | POA: Diagnosis not present

## 2022-06-28 DIAGNOSIS — R87810 Cervical high risk human papillomavirus (HPV) DNA test positive: Secondary | ICD-10-CM

## 2022-06-28 DIAGNOSIS — E049 Nontoxic goiter, unspecified: Secondary | ICD-10-CM | POA: Diagnosis not present

## 2022-06-28 DIAGNOSIS — Z6834 Body mass index (BMI) 34.0-34.9, adult: Secondary | ICD-10-CM

## 2022-06-28 DIAGNOSIS — Z0000 Encounter for general adult medical examination without abnormal findings: Secondary | ICD-10-CM

## 2022-06-28 DIAGNOSIS — E282 Polycystic ovarian syndrome: Secondary | ICD-10-CM

## 2022-06-28 DIAGNOSIS — E7800 Pure hypercholesterolemia, unspecified: Secondary | ICD-10-CM

## 2022-06-28 DIAGNOSIS — E6609 Other obesity due to excess calories: Secondary | ICD-10-CM

## 2022-06-28 LAB — MICROALBUMIN, URINE WAIVED
Creatinine, Urine Waived: 200 mg/dL (ref 10–300)
Microalb, Ur Waived: 10 mg/L (ref 0–19)
Microalb/Creat Ratio: <30 mg/g (ref <30)

## 2022-06-28 LAB — BAYER DCA HB A1C WAIVED: HB A1C (BAYER DCA - WAIVED): 5.7 % — ABNORMAL HIGH (ref 4.8–5.6)

## 2022-06-28 NOTE — Assessment & Plan Note (Signed)
Labs today, repeat pap due in August.

## 2022-06-28 NOTE — Assessment & Plan Note (Signed)
Mild thyroid enlargement on exam. No symptoms, continue to monitor. Check TSH today.

## 2022-06-28 NOTE — Progress Notes (Signed)
BP 131/89   Pulse 69   Temp 98.1 F (36.7 C) (Oral)   Ht '5\' 9"'  (1.753 m)   Wt 239 lb 6.4 oz (108.6 kg)   SpO2 98%   BMI 35.35 kg/m    Subjective:    Patient ID: Misty Reyes, female    DOB: 17-Sep-1981, 41 y.o.   MRN: [id number]  NOTE WRITTEN BY FNP STUDENT.  ASSESSMENT AND PLAN OF CARE REVIEWED WITH STUDENT, AGREE WITH ABOVE FINDINGS AND PLAN.   HPI: Misty Reyes is a 40 y.o. female presenting on 06/28/2022 for comprehensive medical examination. Current medical complaints include:none  She currently lives with: mom and sister Menopausal Symptoms: no  Depression Screen done today and results listed below:     06/28/2022    8:31 AM 11/28/2021    8:11 AM 09/21/2021    2:48 PM 05/31/2021    8:09 AM 03/21/2020    8:10 AM  Depression screen PHQ 2/9  Decreased Interest 0 0 0 0 0  Down, Depressed, Hopeless 0 0 0 0 1  PHQ - 2 Score 0 0 0 0 1  Altered sleeping 0 0 0 0 0  Tired, decreased energy 0 1 0 0 0  Change in appetite 0 0 0 0 0  Feeling bad or failure about yourself  0 0 0 0 0  Trouble concentrating 0 0 0 0 0  Moving slowly or fidgety/restless 0 0 0 0 0  Suicidal thoughts 0 0 0 0 0  PHQ-9 Score 0 1 0 0 1  Difficult doing work/chores Not difficult at all  Not difficult at all  Not difficult at all    The patient does not have a history of falls. I did not complete a risk assessment for falls. A plan of care for falls was not documented.   Past Medical History:  Past Medical History:  Diagnosis Date   Obesity (BMI 30.0-34.9)    PCOS (polycystic ovarian syndrome)     Surgical History:  History reviewed. No pertinent surgical history.  Medications:  No current outpatient medications on file prior to visit.   No current facility-administered medications on file prior to visit.    Allergies:  No Known Allergies  Social History:  Social History   Socioeconomic History   Marital status: Single    Spouse name: Not on file   Number of children: Not on file    Years of education: 12   Highest education level: Bachelor's degree (e.g., BA, AB, BS)  Occupational History   Not on file  Tobacco Use   Smoking status: Never   Smokeless tobacco: Never  Vaping Use   Vaping Use: Never used  Substance and Sexual Activity   Alcohol use: Never   Drug use: Never   Sexual activity: Not Currently    Birth control/protection: None  Other Topics Concern   Not on file  Social History Narrative   Not on file   Social Determinants of Health   Financial Resource Strain: Low Risk  (02/12/2019)   Overall Financial Resource Strain (CARDIA)    Difficulty of Paying Living Expenses: Not hard at all  Food Insecurity: No Food Insecurity (02/12/2019)   Hunger Vital Sign    Worried About Running Out of Food in the Last Year: Never true    Ran Out of Food in the Last Year: Never true  Transportation Needs: No Transportation Needs (02/12/2019)   PRAPARE - Hydrologist (Medical): No  Lack of Transportation (Non-Medical): No  Physical Activity: Insufficiently Active (02/10/2020)   Exercise Vital Sign    Days of Exercise per Week: 3 days    Minutes of Exercise per Session: 30 min  Stress: No Stress Concern Present (02/12/2019)   [place]    Feeling of Stress : Not at all  Social Connections: Unknown (02/12/2019)   Social Connection and Isolation Panel [NHANES]    Frequency of Communication with Friends and Family: Once a week    Frequency of Social Gatherings with Friends and Family: Once a week    Attends Religious Services: Never    Marine scientist or Organizations: No    Attends Archivist Meetings: Never    Marital Status: Not on file  Intimate Partner Violence: Not At Risk (02/12/2019)   Humiliation, Afraid, Rape, and Kick questionnaire    Fear of Current or Ex-Partner: No    Emotionally Abused: No    Physically Abused: No    Sexually Abused: No    Social History   Tobacco Use  Smoking Status Never  Smokeless Tobacco Never   Social History   Substance and Sexual Activity  Alcohol Use Never    Family History:  Family History  Problem Relation Age of Onset   Diabetes Paternal Grandmother    Breast cancer Neg Hx    Ovarian cancer Neg Hx     Past medical history, surgical history, medications, allergies, family history and social history reviewed with patient today and changes made to appropriate areas of the chart.   ROS All other ROS negative except what is listed above and in the HPI.      Objective:    BP 131/89   Pulse 69   Temp 98.1 F (36.7 C) (Oral)   Ht '5\' 9"'  (1.753 m)   Wt 239 lb 6.4 oz (108.6 kg)   SpO2 98%   BMI 35.35 kg/m   Wt Readings from Last 3 Encounters:  06/28/22 239 lb 6.4 oz (108.6 kg)  11/28/21 234 lb 6.4 oz (106.3 kg)  10/03/21 235 lb (106.6 kg)    Physical Exam Vitals and nursing note reviewed. Exam conducted with a chaperone present.  Constitutional:      General: She is not in acute distress.    Appearance: Normal appearance. She is obese. She is not toxic-appearing.  HENT:     Head: Normocephalic and atraumatic.     Right Ear: Hearing, tympanic membrane, ear canal and external ear normal. No drainage.     Left Ear: Hearing, tympanic membrane, ear canal and external ear normal. No drainage.     Nose: Nose normal.     Mouth/Throat:     Lips: Pink.     Mouth: Mucous membranes are moist.     Pharynx: Oropharynx is clear. Uvula midline. No posterior oropharyngeal erythema.  Eyes:     General: Lids are normal.     Extraocular Movements: Extraocular movements intact.     Pupils: Pupils are equal, round, and reactive to light.  Neck:     Thyroid: Thyromegaly present. No thyroid tenderness.  Cardiovascular:     Rate and Rhythm: Normal rate and regular rhythm.     Pulses: Normal pulses.     Heart sounds: Normal heart sounds. No murmur heard. Pulmonary:     Effort: Pulmonary  effort is normal. No accessory muscle usage.     Breath sounds: Normal breath sounds.  Chest:  Breasts:    Right: Normal.     Left: Normal.  Abdominal:     General: Bowel sounds are normal.     Palpations: Abdomen is soft. There is no hepatomegaly or splenomegaly.     Tenderness: There is no abdominal tenderness.  Musculoskeletal:     Cervical back: Normal range of motion.  Lymphadenopathy:     Head:     Right side of head: No submental, submandibular, tonsillar, preauricular or posterior auricular adenopathy.     Left side of head: No submental, submandibular, tonsillar, preauricular or posterior auricular adenopathy.  Skin:    General: Skin is warm.     Capillary Refill: Capillary refill takes less than 2 seconds.  Neurological:     General: No focal deficit present.     Mental Status: She is alert.     Deep Tendon Reflexes: Reflexes are normal and symmetric.  Psychiatric:        Attention and Perception: Attention normal.        Mood and Affect: Mood normal.        Speech: Speech normal.        Behavior: Behavior is cooperative.        Thought Content: Thought content normal.        Cognition and Memory: Cognition normal.        Judgment: Judgment normal.     Results for orders placed or performed in visit on 09/21/21  Urine Culture   Specimen: Urine   UR  Result Value Ref Range   Urine Culture, Routine Final report    Organism ID, Bacteria Comment   Microscopic Examination   Urine  Result Value Ref Range   WBC, UA 0-5 0 - 5 /hpf   RBC, Urine 0-2 0 - 2 /hpf   Epithelial Cells (non renal) 0-10 0 - 10 /hpf   Mucus, UA Present (A) Not Estab.   Bacteria, UA Few (A) None seen/Few  Bayer DCA Hb A1c Waived  Result Value Ref Range   HB A1C (BAYER DCA - WAIVED) 5.5 4.8 - 5.6 %  Comprehensive metabolic panel  Result Value Ref Range   Glucose 88 70 - 99 mg/dL   BUN 9 6 - 24 mg/dL   Creatinine, Ser 0.71 0.57 - 1.00 mg/dL   eGFR 110 >59 mL/min/1.73   BUN/Creatinine  Ratio 13 9 - 23   Sodium 138 134 - 144 mmol/L   Potassium 4.1 3.5 - 5.2 mmol/L   Chloride 103 96 - 106 mmol/L   CO2 21 20 - 29 mmol/L   Calcium 9.0 8.7 - 10.2 mg/dL   Total Protein 7.4 6.0 - 8.5 g/dL   Albumin 4.4 3.8 - 4.8 g/dL   Globulin, Total 3.0 1.5 - 4.5 g/dL   Albumin/Globulin Ratio 1.5 1.2 - 2.2   Bilirubin Total 0.6 0.0 - 1.2 mg/dL   Alkaline Phosphatase 63 44 - 121 IU/L   AST 22 0 - 40 IU/L   ALT 31 0 - 32 IU/L  Urinalysis, Routine w reflex microscopic  Result Value Ref Range   Specific Gravity, UA 1.025 1.005 - 1.030   pH, UA 5.5 5.0 - 7.5   Color, UA Yellow Yellow   Appearance Ur Cloudy (A) Clear   Leukocytes,UA Trace (A) Negative   Protein,UA Negative Negative/Trace   Glucose, UA Negative Negative   Ketones, UA Negative Negative   RBC, UA Trace (A) Negative   Bilirubin, UA Negative Negative   Urobilinogen, Ur 0.2 0.2 -  1.0 mg/dL   Nitrite, UA Negative Negative   Microscopic Examination See below:       Assessment & Plan:   Problem List Items Addressed This Visit       Endocrine   Enlarged thyroid    Mild thyroid enlargement on exam. No symptoms, continue to monitor. Check TSH today.       Relevant Orders   TSH   PCOS (polycystic ovarian syndrome)    Ongoing. Check A1c today.  Continue collaboration with GYN, referral placed for provider in [place] where patient will be moving.  Initiate diabetes medication as needed dependent on labs.  Education on PCOS diet provided.      Relevant Orders   Bayer [place] Hb A1c Waived   Ambulatory referral to Gynecology     Musculoskeletal and Integument   Lichen planus    Ongoing, continue Triamcinolone cream as needed for flares.        Other   Cervical high risk HPV (human papillomavirus) test positive    Noted in the past. Next pap due in August. Referral to GYN placed.       Relevant Orders   Ambulatory referral to Gynecology   Elevated hemoglobin A1c    Last A1c 6%, recheck A1c and CMP today. Recommend a  continued focus on diet and regular activity.       Relevant Orders   Bayer DCA Hb A1c Waived   Microalbumin, Urine Waived   Elevated LDL cholesterol level - Primary    Noted on past labs. Recheck today, no medication at this time. Continue with a heavy focus on diet.      Relevant Orders   Comprehensive metabolic panel   Lipid Panel w/o Chol/HDL Ratio   Obesity    BMI 35.35 with weight loss continuing, she is make changes in lifestyle.  Praised for this.  Recommended eating smaller high protein, low fat meals more frequently and exercising 30 mins a day 5 times a week with a goal of 10-15lb weight loss in the next 3 months. Patient voiced their understanding and motivation to adhere to these recommendations.       Preventative health care    Labs today, repeat pap due in August.       Vitamin D deficiency    Noted on past labs, continue supplement daily and recheck level today.       Relevant Orders   VITAMIN D 25 Hydroxy (Vit-D Deficiency, Fractures)   Other Visit Diagnoses     Encounter for annual physical exam        Annual physical today with labs and health maintenance reviewed, discussed with patient.   Relevant Orders   CBC with Differential/Platelet        Follow up plan: Return if symptoms worsen or fail to improve.   LABORATORY TESTING:  - Pap smear: up to date  IMMUNIZATIONS:   - Tdap: Tetanus vaccination status reviewed: last tetanus booster within 10 years. - Influenza: Up to date - Pneumovax: Not applicable - Prevnar: Not applicable - COVID: Up to date - HPV: Not applicable - Shingrix vaccine: Not applicable  SCREENING: -Mammogram: obtain with new PCP in [place] - Colonoscopy: Not applicable  - Bone Density: Not applicable  -Hearing Test: Not applicable  -Spirometry: Not applicable   PATIENT COUNSELING:   Advised to take 1 mg of folate supplement per day if capable of pregnancy.   Sexuality: Discussed sexually transmitted diseases,  partner selection, use of condoms, avoidance of unintended  pregnancy  and contraceptive alternatives.   Advised to avoid cigarette smoking.  I discussed with the patient that most people either abstain from alcohol or drink within safe limits (<=14/week and <=4 drinks/occasion for males, <=7/weeks and <= 3 drinks/occasion for females) and that the risk for alcohol disorders and other health effects rises proportionally with the number of drinks per week and how often a drinker exceeds daily limits.  Discussed cessation/primary prevention of drug use and availability of treatment for abuse.   Diet: Encouraged to adjust caloric intake to maintain  or achieve ideal body weight, to reduce intake of dietary saturated fat and total fat, to limit sodium intake by avoiding high sodium foods and not adding table salt, and to maintain adequate dietary potassium and calcium preferably from fresh fruits, vegetables, and low-fat dairy products.    Stressed the importance of regular exercise  Injury prevention: Discussed safety belts, safety helmets, smoke detector, smoking near bedding or upholstery.   Dental health: Discussed importance of regular tooth brushing, flossing, and dental visits.    NEXT PREVENTATIVE PHYSICAL DUE IN 1 YEAR. Return if symptoms worsen or fail to improve.

## 2022-06-28 NOTE — Assessment & Plan Note (Signed)
Ongoing, continue Triamcinolone cream as needed for flares.

## 2022-06-28 NOTE — Assessment & Plan Note (Signed)
Ongoing. Check A1c today.  Continue collaboration with GYN, referral placed for provider in [place] where patient will be moving.  Initiate diabetes medication as needed dependent on labs.  Education on PCOS diet provided.

## 2022-06-28 NOTE — Assessment & Plan Note (Signed)
Noted on past labs. Recheck today, no medication at this time. Continue with a heavy focus on diet.

## 2022-06-28 NOTE — Assessment & Plan Note (Signed)
Last A1c 6%, recheck A1c and CMP today. Recommend a continued focus on diet and regular activity.

## 2022-06-28 NOTE — Assessment & Plan Note (Signed)
Noted on past labs, continue supplement daily and recheck level today.

## 2022-06-28 NOTE — Progress Notes (Deleted)
BP 131/89   Pulse 69   Temp 98.1 F (36.7 C) (Oral)   Ht '5\' 9"'  (1.753 m)   Wt 239 lb 6.4 oz (108.6 kg)   SpO2 98%   BMI 35.35 kg/m    Subjective:    Patient ID: Misty Reyes, female    DOB: 1981-04-12, 41 y.o.   MRN: [id number]  HPI: Misty Reyes is a 41 y.o. female presenting on 06/28/2022 for comprehensive medical examination. Current medical complaints include:none  She currently lives with: Menopausal Symptoms: no    Depression Screen done today and results listed below:     11/28/2021    8:11 AM 09/21/2021    2:48 PM 05/31/2021    8:09 AM 03/21/2020    8:10 AM 02/10/2020   10:41 AM  Depression screen PHQ 2/9  Decreased Interest 0 0 0 0 0  Down, Depressed, Hopeless 0 0 0 1 0  PHQ - 2 Score 0 0 0 1 0  Altered sleeping 0 0 0 0   Tired, decreased energy 1 0 0 0   Change in appetite 0 0 0 0   Feeling bad or failure about yourself  0 0 0 0   Trouble concentrating 0 0 0 0   Moving slowly or fidgety/restless 0 0 0 0   Suicidal thoughts 0 0 0 0   PHQ-9 Score 1 0 0 1   Difficult doing work/chores  Not difficult at all  Not difficult at all     The patient {has/does not have:19849} a history of falls. I {did/did not:19850} complete a risk assessment for falls. A plan of care for falls {was/was not:19852} documented.   Past Medical History:  Past Medical History:  Diagnosis Date   Obesity (BMI 30.0-34.9)    PCOS (polycystic ovarian syndrome)     Surgical History:  History reviewed. No pertinent surgical history.  Medications:  No current outpatient medications on file prior to visit.   No current facility-administered medications on file prior to visit.    Allergies:  No Known Allergies  Social History:  Social History   Socioeconomic History   Marital status: Single    Spouse name: Not on file   Number of children: Not on file   Years of education: 12   Highest education level: Bachelor's degree (e.g., BA, AB, BS)  Occupational History   Not on  file  Tobacco Use   Smoking status: Never   Smokeless tobacco: Never  Vaping Use   Vaping Use: Never used  Substance and Sexual Activity   Alcohol use: Never   Drug use: Never   Sexual activity: Not Currently    Birth control/protection: None  Other Topics Concern   Not on file  Social History Narrative   Not on file   Social Determinants of Health   Financial Resource Strain: Low Risk  (02/12/2019)   Overall Financial Resource Strain (CARDIA)    Difficulty of Paying Living Expenses: Not hard at all  Food Insecurity: No Food Insecurity (02/12/2019)   Hunger Vital Sign    Worried About Running Out of Food in the Last Year: Never true    Ran Out of Food in the Last Year: Never true  Transportation Needs: No Transportation Needs (02/12/2019)   PRAPARE - Hydrologist (Medical): No    Lack of Transportation (Non-Medical): No  Physical Activity: Insufficiently Active (02/10/2020)   Exercise Vital Sign    Days of Exercise per Week: 3 days  Minutes of Exercise per Session: 30 min  Stress: No Stress Concern Present (02/12/2019)   [place]    Feeling of Stress : Not at all  Social Connections: Unknown (02/12/2019)   Social Connection and Isolation Panel [NHANES]    Frequency of Communication with Friends and Family: Once a week    Frequency of Social Gatherings with Friends and Family: Once a week    Attends Religious Services: Never    Marine scientist or Organizations: No    Attends Archivist Meetings: Never    Marital Status: Not on file  Intimate Partner Violence: Not At Risk (02/12/2019)   Humiliation, Afraid, Rape, and Kick questionnaire    Fear of Current or Ex-Partner: No    Emotionally Abused: No    Physically Abused: No    Sexually Abused: No   Social History   Tobacco Use  Smoking Status Never  Smokeless Tobacco Never   Social History   Substance and Sexual  Activity  Alcohol Use Never    Family History:  Family History  Problem Relation Age of Onset   Diabetes Paternal Grandmother    Breast cancer Neg Hx    Ovarian cancer Neg Hx     Past medical history, surgical history, medications, allergies, family history and social history reviewed with patient today and changes made to appropriate areas of the chart.   ROS All other ROS negative except what is listed above and in the HPI.      Objective:    BP 131/89   Pulse 69   Temp 98.1 F (36.7 C) (Oral)   Ht '5\' 9"'  (1.753 m)   Wt 239 lb 6.4 oz (108.6 kg)   SpO2 98%   BMI 35.35 kg/m   Wt Readings from Last 3 Encounters:  06/28/22 239 lb 6.4 oz (108.6 kg)  11/28/21 234 lb 6.4 oz (106.3 kg)  10/03/21 235 lb (106.6 kg)    Physical Exam  Results for orders placed or performed in visit on 09/21/21  Urine Culture   Specimen: Urine   UR  Result Value Ref Range   Urine Culture, Routine Final report    Organism ID, Bacteria Comment   Microscopic Examination   Urine  Result Value Ref Range   WBC, UA 0-5 0 - 5 /hpf   RBC, Urine 0-2 0 - 2 /hpf   Epithelial Cells (non renal) 0-10 0 - 10 /hpf   Mucus, UA Present (A) Not Estab.   Bacteria, UA Few (A) None seen/Few  Bayer DCA Hb A1c Waived  Result Value Ref Range   HB A1C (BAYER DCA - WAIVED) 5.5 4.8 - 5.6 %  Comprehensive metabolic panel  Result Value Ref Range   Glucose 88 70 - 99 mg/dL   BUN 9 6 - 24 mg/dL   Creatinine, Ser 0.71 0.57 - 1.00 mg/dL   eGFR 110 >59 mL/min/1.73   BUN/Creatinine Ratio 13 9 - 23   Sodium 138 134 - 144 mmol/L   Potassium 4.1 3.5 - 5.2 mmol/L   Chloride 103 96 - 106 mmol/L   CO2 21 20 - 29 mmol/L   Calcium 9.0 8.7 - 10.2 mg/dL   Total Protein 7.4 6.0 - 8.5 g/dL   Albumin 4.4 3.8 - 4.8 g/dL   Globulin, Total 3.0 1.5 - 4.5 g/dL   Albumin/Globulin Ratio 1.5 1.2 - 2.2   Bilirubin Total 0.6 0.0 - 1.2 mg/dL   Alkaline Phosphatase  63 44 - 121 IU/L   AST 22 0 - 40 IU/L   ALT 31 0 - 32 IU/L   Urinalysis, Routine w reflex microscopic  Result Value Ref Range   Specific Gravity, UA 1.025 1.005 - 1.030   pH, UA 5.5 5.0 - 7.5   Color, UA Yellow Yellow   Appearance Ur Cloudy (A) Clear   Leukocytes,UA Trace (A) Negative   Protein,UA Negative Negative/Trace   Glucose, UA Negative Negative   Ketones, UA Negative Negative   RBC, UA Trace (A) Negative   Bilirubin, UA Negative Negative   Urobilinogen, Ur 0.2 0.2 - 1.0 mg/dL   Nitrite, UA Negative Negative   Microscopic Examination See below:       Assessment & Plan:   Problem List Items Addressed This Visit       Endocrine   Enlarged thyroid   Relevant Orders   TSH   PCOS (polycystic ovarian syndrome)   Relevant Orders   Bayer DCA Hb A1c Waived     Other   Cervical high risk HPV (human papillomavirus) test positive   Relevant Orders   Cytology - PAP   Elevated hemoglobin A1c   Relevant Orders   Bayer DCA Hb A1c Waived   Microalbumin, Urine Waived   Elevated LDL cholesterol level - Primary   Relevant Orders   Comprehensive metabolic panel   Lipid Panel w/o Chol/HDL Ratio   Obesity   Vitamin D deficiency   Relevant Orders   VITAMIN D 25 Hydroxy (Vit-D Deficiency, Fractures)   Other Visit Diagnoses     Encounter for annual physical exam        Annual physical today with labs and health maintenance reviewed, discussed with patient.   Relevant Orders   CBC with Differential/Platelet        Follow up plan: No follow-ups on file.   LABORATORY TESTING:  - Pap smear: up to date  IMMUNIZATIONS:   - Tdap: Tetanus vaccination status reviewed: last tetanus booster within 10 years. - Influenza: Up to date - Pneumovax: Not applicable - Prevnar: Not applicable - COVID: Up to date - HPV: Not applicable - Shingrix vaccine: Not applicable  SCREENING: -Mammogram: obtain with new PCP in [place] - Colonoscopy: Not applicable  - Bone Density: Not applicable  -Hearing Test: Not applicable  -Spirometry: Not  applicable   PATIENT COUNSELING:   Advised to take 1 mg of folate supplement per day if capable of pregnancy.   Sexuality: Discussed sexually transmitted diseases, partner selection, use of condoms, avoidance of unintended pregnancy  and contraceptive alternatives.   Advised to avoid cigarette smoking.  I discussed with the patient that most people either abstain from alcohol or drink within safe limits (<=14/week and <=4 drinks/occasion for males, <=7/weeks and <= 3 drinks/occasion for females) and that the risk for alcohol disorders and other health effects rises proportionally with the number of drinks per week and how often a drinker exceeds daily limits.  Discussed cessation/primary prevention of drug use and availability of treatment for abuse.   Diet: Encouraged to adjust caloric intake to maintain  or achieve ideal body weight, to reduce intake of dietary saturated fat and total fat, to limit sodium intake by avoiding high sodium foods and not adding table salt, and to maintain adequate dietary potassium and calcium preferably from fresh fruits, vegetables, and low-fat dairy products.    Stressed the importance of regular exercise  Injury prevention: Discussed safety belts, safety helmets, smoke detector, smoking near bedding or  upholstery.   Dental health: Discussed importance of regular tooth brushing, flossing, and dental visits.    NEXT PREVENTATIVE PHYSICAL DUE IN 1 YEAR. No follow-ups on file.

## 2022-06-28 NOTE — Assessment & Plan Note (Signed)
BMI 35.35 with weight loss continuing, she is make changes in lifestyle.  Praised for this.  Recommended eating smaller high protein, low fat meals more frequently and exercising 30 mins a day 5 times a week with a goal of 10-15lb weight loss in the next 3 months. Patient voiced their understanding and motivation to adhere to these recommendations.

## 2022-06-28 NOTE — Assessment & Plan Note (Signed)
Noted in the past. Next pap due in August. Referral to GYN placed.

## 2022-06-28 NOTE — Progress Notes (Signed)
Contacted via MyChart   Good afternoon Misty Reyes -- your diabetes testing returned.  The A1C is the diabetes testing we talked about, this looks at your blood sugars over the past 3 months and turns the average into a number.  Your number is 5.7%, meaning you are prediabetic.  Any number 5.7 to 6.4 is considered prediabetes and any number 6.5 or greater is considered diabetes.   I would recommend heavy focus on decreasing foods high in sugar and your intake of things like bread products, pasta, and rice.  The American Diabetes Association online has a large amount of information on diet changes to make.  You should recheck this number in 6 months to ensure you are not continuing to trend upwards and move into diabetes.  Have a good day.  Will miss you all:)

## 2022-06-29 LAB — COMPREHENSIVE METABOLIC PANEL
ALT: 22 IU/L (ref 0–32)
AST: 19 IU/L (ref 0–40)
Albumin/Globulin Ratio: 1.6 (ref 1.2–2.2)
Albumin: 4.4 g/dL (ref 3.9–4.9)
Alkaline Phosphatase: 61 IU/L (ref 44–121)
BUN/Creatinine Ratio: 12 (ref 9–23)
BUN: 8 mg/dL (ref 6–24)
Bilirubin Total: 0.7 mg/dL (ref 0.0–1.2)
CO2: 23 mmol/L (ref 20–29)
Calcium: 9.5 mg/dL (ref 8.7–10.2)
Chloride: 101 mmol/L (ref 96–106)
Creatinine, Ser: 0.68 mg/dL (ref 0.57–1.00)
Globulin, Total: 2.8 g/dL (ref 1.5–4.5)
Glucose: 97 mg/dL (ref 70–99)
Potassium: 4.2 mmol/L (ref 3.5–5.2)
Sodium: 137 mmol/L (ref 134–144)
Total Protein: 7.2 g/dL (ref 6.0–8.5)
eGFR: 112 mL/min/{1.73_m2} (ref >59)

## 2022-06-29 LAB — CBC WITH DIFFERENTIAL/PLATELET
Basophils Absolute: 0 10*3/uL (ref 0.0–0.2)
Basos: 1 %
EOS (ABSOLUTE): 0.2 10*3/uL (ref 0.0–0.4)
Eos: 4 %
Hematocrit: 37.6 % (ref 34.0–46.6)
Hemoglobin: 12.4 g/dL (ref 11.1–15.9)
Immature Grans (Abs): 0 10*3/uL (ref 0.0–0.1)
Immature Granulocytes: 0 %
Lymphocytes Absolute: 1.9 10*3/uL (ref 0.7–3.1)
Lymphs: 39 %
MCH: 28.6 pg (ref 26.6–33.0)
MCHC: 33 g/dL (ref 31.5–35.7)
MCV: 87 fL (ref 79–97)
Monocytes Absolute: 0.5 10*3/uL (ref 0.1–0.9)
Monocytes: 10 %
Neutrophils Absolute: 2.2 10*3/uL (ref 1.4–7.0)
Neutrophils: 46 %
Platelets: 238 10*3/uL (ref 150–450)
RBC: 4.34 x10E6/uL (ref 3.77–5.28)
RDW: 13.2 % (ref 11.7–15.4)
WBC: 4.9 10*3/uL (ref 3.4–10.8)

## 2022-06-29 LAB — LIPID PANEL W/O CHOL/HDL RATIO
Cholesterol, Total: 174 mg/dL (ref 100–199)
HDL: 48 mg/dL (ref >39)
LDL Chol Calc (NIH): 110 mg/dL — ABNORMAL HIGH (ref 0–99)
Triglycerides: 85 mg/dL (ref 0–149)
VLDL Cholesterol Cal: 16 mg/dL (ref 5–40)

## 2022-06-29 LAB — VITAMIN D 25 HYDROXY (VIT D DEFICIENCY, FRACTURES): Vit D, 25-Hydroxy: 29 ng/mL — ABNORMAL LOW (ref 30.0–100.0)

## 2022-06-29 LAB — TSH: TSH: 1.04 u[IU]/mL (ref 0.450–4.500)

## 2022-06-29 NOTE — Progress Notes (Signed)
Contacted via MyChart   Good afternoon Misty Reyes, your labs have returned and overall look great with exception of Vitamin D level and lipid panel.  I do recommend to continue to take Vitamin D3 2000 units daily.  Your LDL is above normal. The LDL is the bad cholesterol. Over time and in combination with inflammation and other factors, this contributes to plaque which in turn may lead to stroke and/or heart attack down the road. Sometimes high LDL is primarily genetic, and people might be eating all the right foods but still have high numbers. Other times, there is room for improvement in one's diet and eating healthier can bring this number down and potentially reduce one's risk of heart attack and/or stroke.   To reduce your LDL, Remember - more fruits and vegetables, more fish, and limit red meat and dairy products. More soy, nuts, beans, barley, lentils, oats and plant sterol ester enriched margarine instead of butter. I also encourage eliminating sugar and processed food. Remember, shop on the outside of the grocery store and visit your [employer]. If you would like to talk with me about dietary changes for your cholesterol, please let me know. We should recheck your cholesterol in 12 months.  Any questions? Keep being awesome!!  Thank you for allowing me to participate in your care.  I appreciate you. Kindest regards, Krislynn Gronau

## 2022-10-04 ENCOUNTER — Ambulatory Visit: Payer: BC Managed Care – PPO | Admitting: Urology

## 2022-12-30 IMAGING — CT CT ABD-PELV W/O CM
2 of 4 series · 16 of 46 positions shown, 18 images · non-contrast
Comparison: None.

CLINICAL DATA: Right lower quadrant pain

EXAM:
CT ABDOMEN AND PELVIS WITHOUT CONTRAST
TECHNIQUE: Multidetector CT imaging of the abdomen and pelvis was performed
following the standard protocol without IV contrast.

[Series 2: routine abd/pel wo · axial · 0.86mm/px · z∈[-616,-156]mm · 13 of 100 slices shown, 15 images]
[im 4/100  soft-tissue]
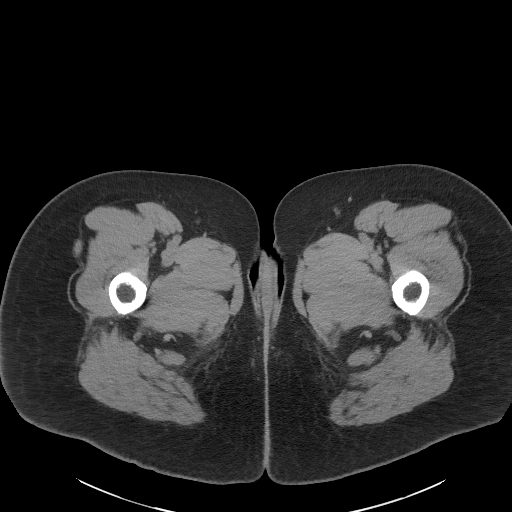
[im 4/100  bone]
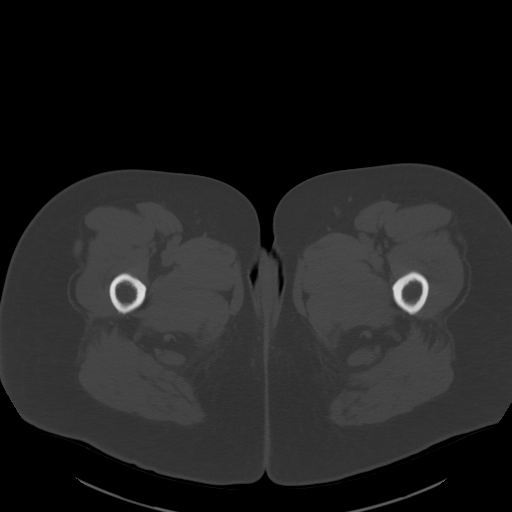
[im 12/100  soft-tissue]
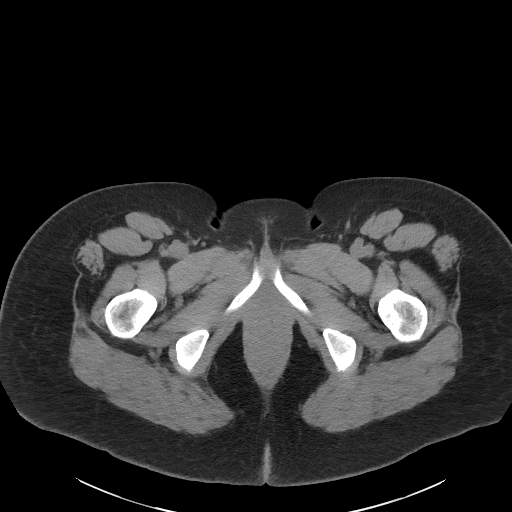
[im 20/100  soft-tissue]
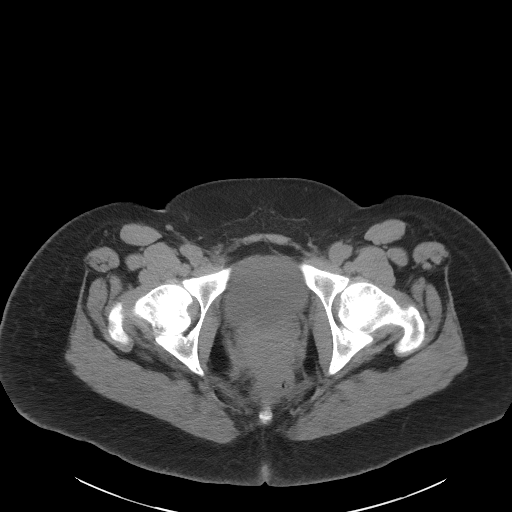
[im 27/100  soft-tissue]
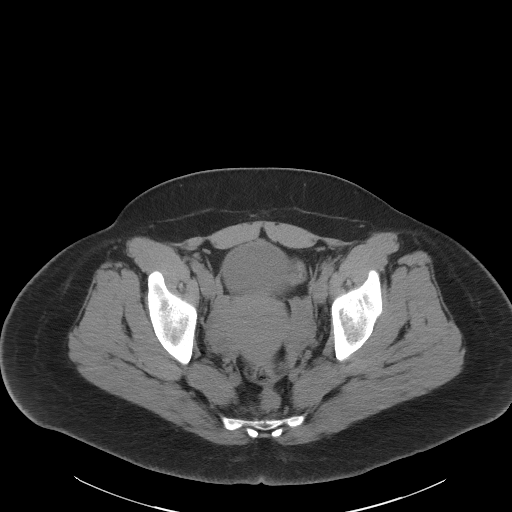
[im 35/100  soft-tissue]
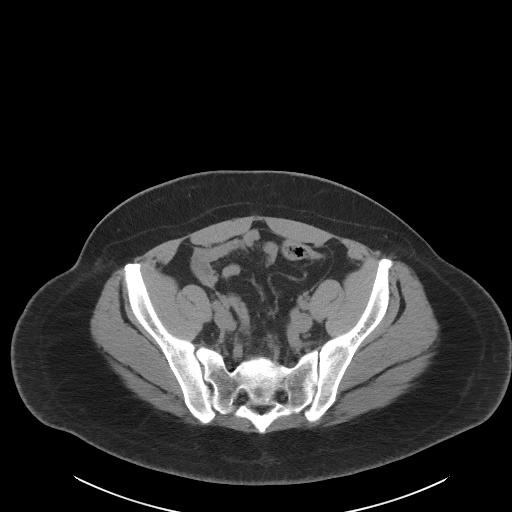
[im 42/100  soft-tissue]
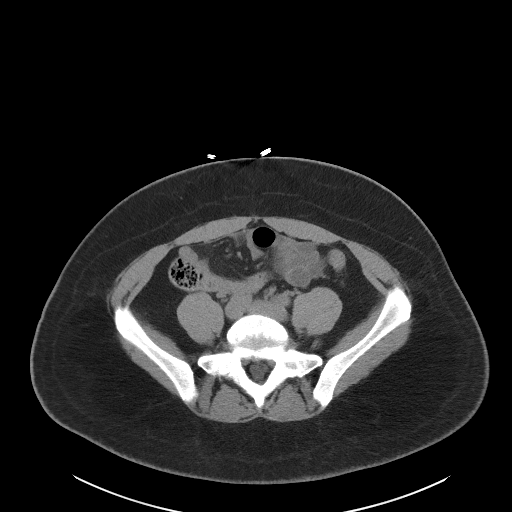
[im 50/100  soft-tissue]
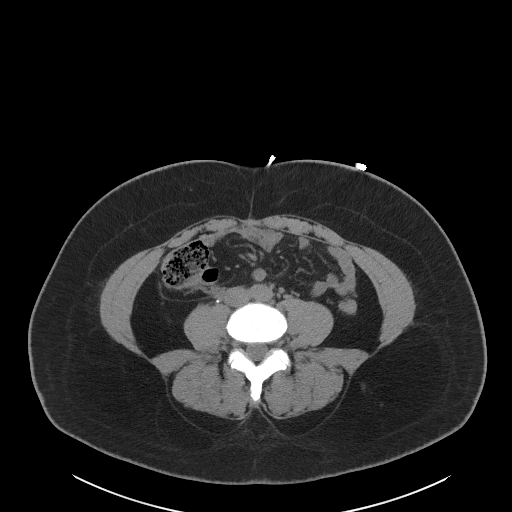
[im 58/100  soft-tissue]
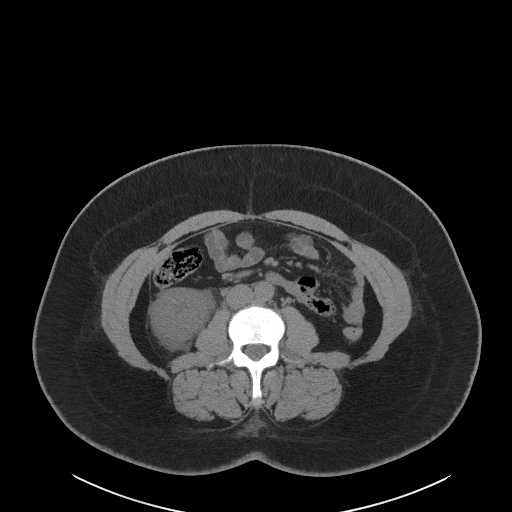
[im 65/100  soft-tissue]
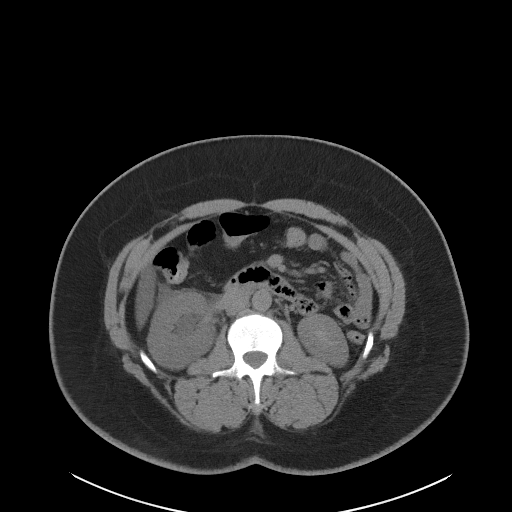
[im 65/100  bone]
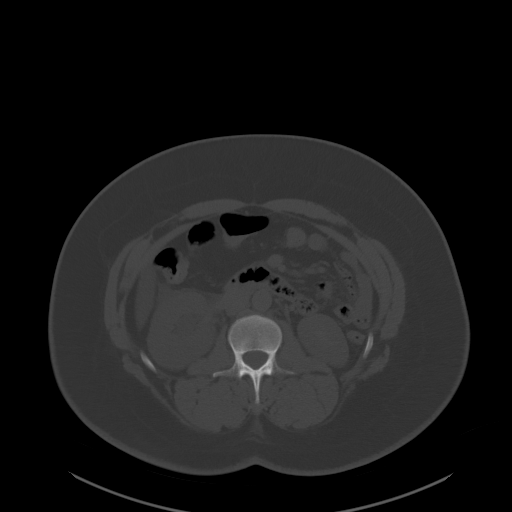
[im 73/100  soft-tissue]
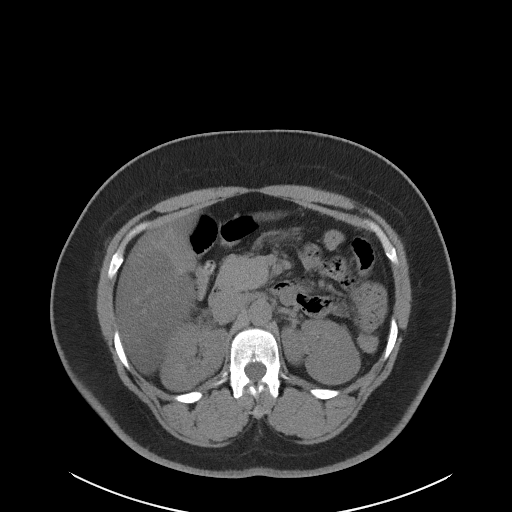
[im 80/100  soft-tissue]
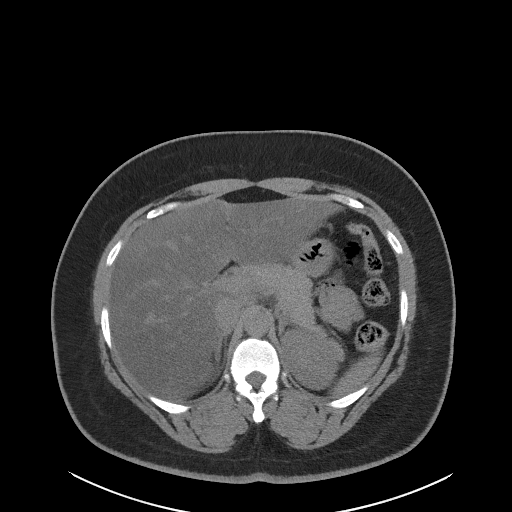
[im 88/100  soft-tissue]
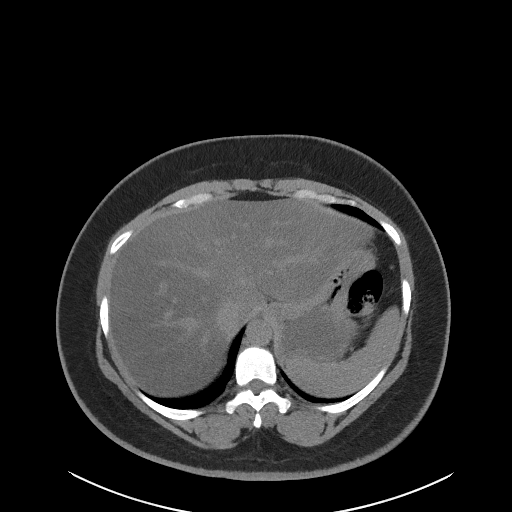
[im 96/100  soft-tissue]
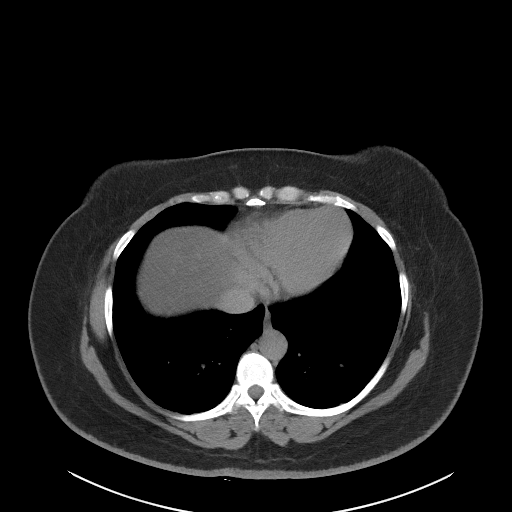

[Series 5: coronal st · coronal · 0.77mm/px · 3 of 104 slices shown]
[im 35/104  soft-tissue]
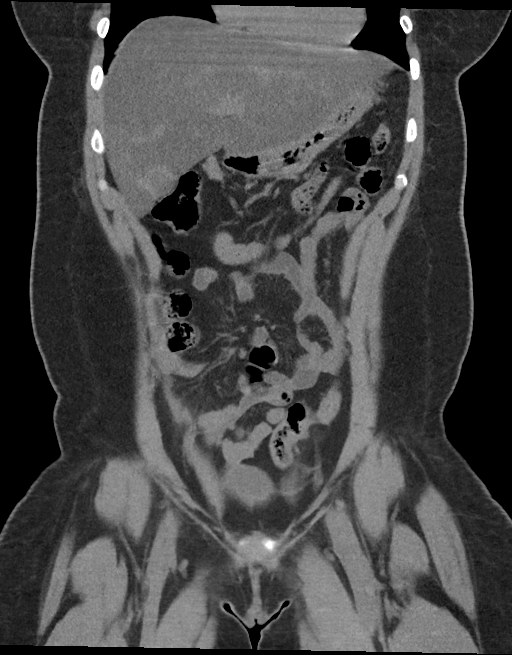
[im 46/104  soft-tissue]
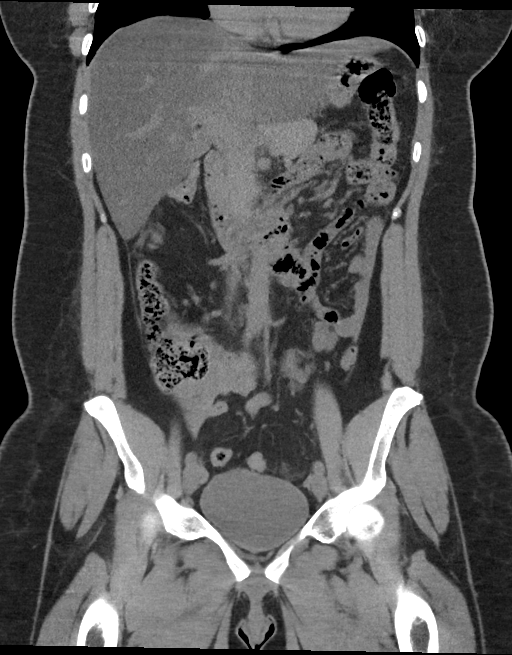
[im 58/104  soft-tissue]
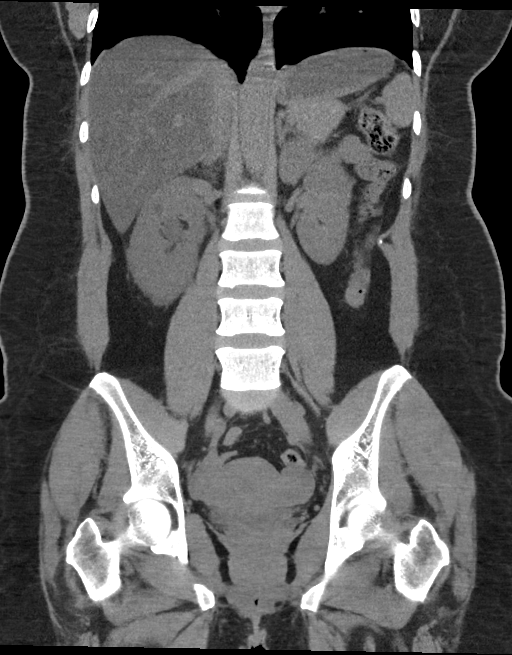

[16 of 46 positions shown; findings below may reference images not displayed]

FINDINGS: Lower chest: The lung bases are clear. The imaged heart is
unremarkable.

Hepatobiliary: The liver is markedly hypoattenuating consistent with
severe fatty infiltration. Ill-defined hyperdensity along the
gallbladder fossa may reflect areas of fatty sparing. There are no
focal lesions, within the confines of noncontrast technique. The
gallbladder is unremarkable. There is no biliary ductal dilatation.

Pancreas: Unremarkable

Spleen: Unremarkable

Adrenals/Urinary Tract: The adrenals are unremarkable.

There is a 3 mm stone in the mid right ureter with mild upstream
hydroureteronephrosis and perinephric stranding. The kidneys are
otherwise unremarkable. No other focal lesions or stones are
identified. There is no left hydronephrosis. The bladder is
unremarkable.

Stomach/Bowel: The stomach is unremarkable. There is no evidence of
bowel obstruction. There is no abnormal bowel wall thickening or
inflammatory change. The appendix is not definitively identified.

Vascular/Lymphatic: The abdominal aorta is nonaneurysmal. There is
no abdominal or pelvic lymphadenopathy.

Reproductive: The uterus and adnexa are unremarkable.

Other: There is scattered stranding/free fluid in the right
hemiabdomen. There is no free air.

Musculoskeletal: There is no acute osseous abnormality or aggressive
osseous lesion.
IMPRESSION: 1. 3 mm stone in the mid right ureter with mild upstream
hydroureteronephrosis and perinephric stranding.
2. Severe fatty infiltration of the liver.

## 2023-02-09 IMAGING — CR DG ABDOMEN 1V
1 series · 2 of 2 positions shown · non-contrast
Comparison: [facility] 09/07/2021

CLINICAL DATA: Kidney stones

EXAM:
ABDOMEN - 1 VIEW

[Series 1: dg abd 1 view · 0.14mm/px · 2 of 2 slices shown]
[im 1/2]
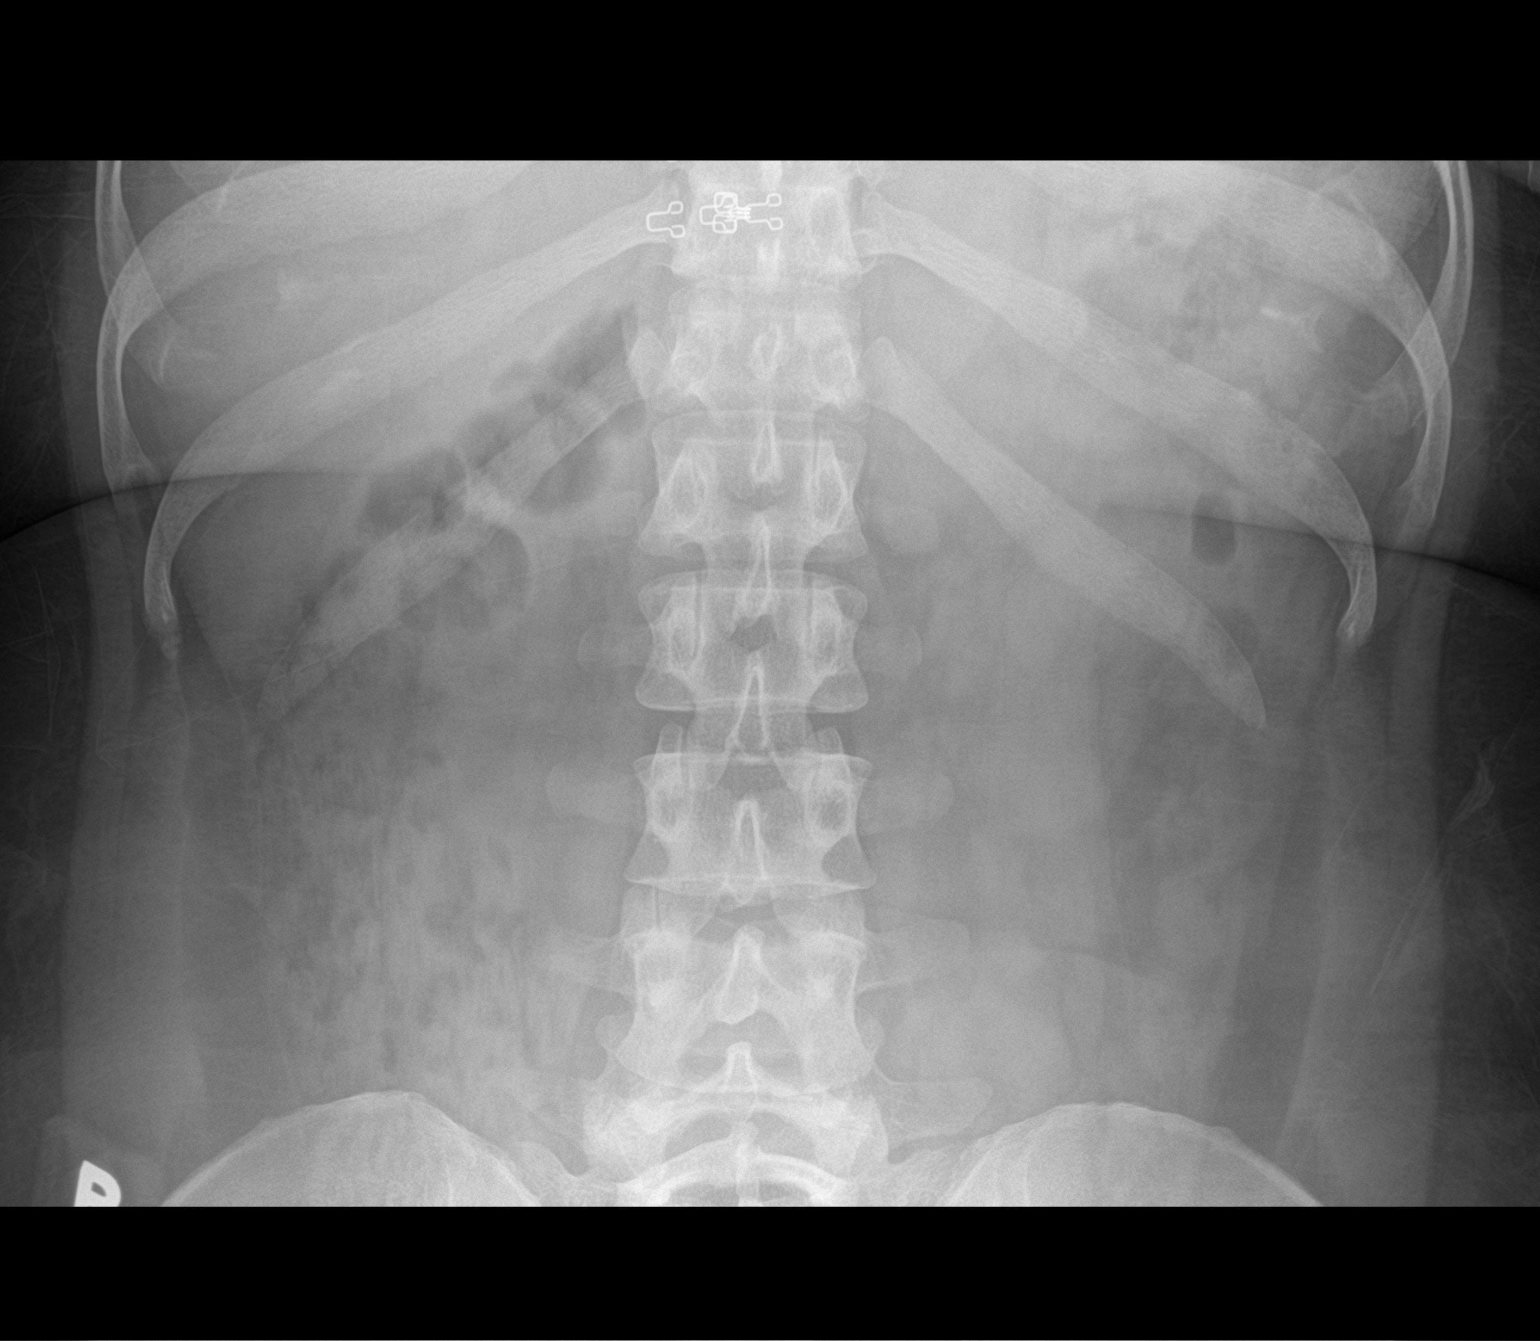
[im 2/2]
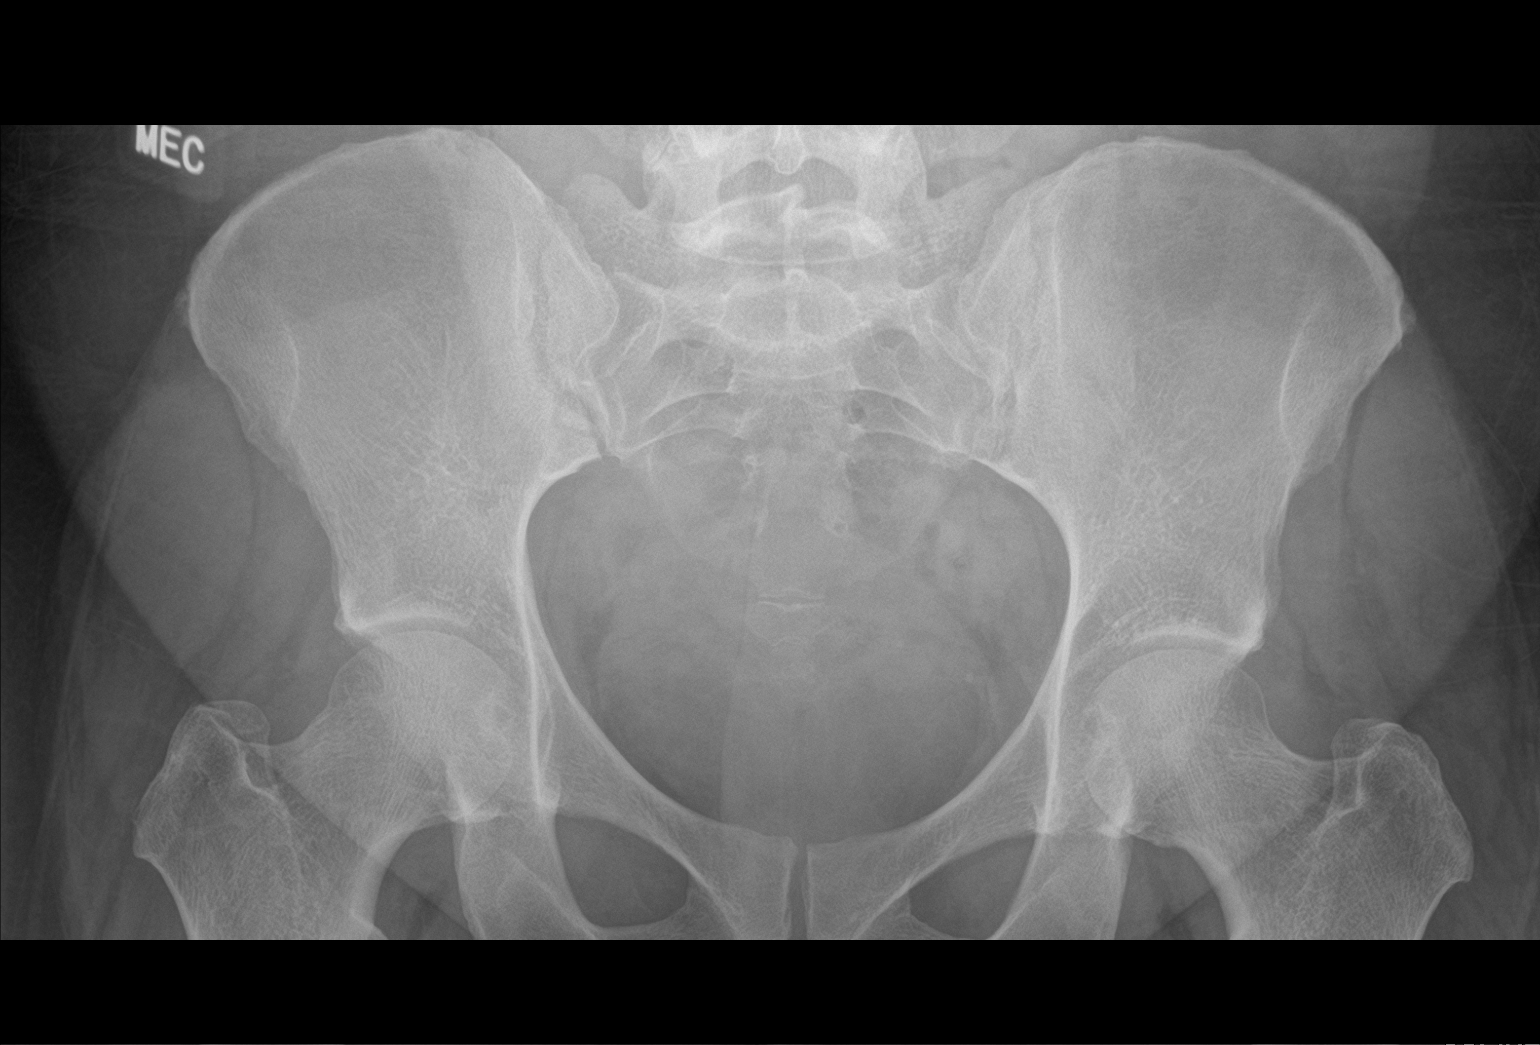

[2 of 2 positions shown; findings below may reference images not displayed]

FINDINGS: Previous calculus in the right pelvis is no longer visualized. No
definite nephrolithiasis visualized. Bowel gas pattern is within
normal limits.
IMPRESSION: No nephrolithiasis visualized. Previous calculus in the right pelvis
no longer visualized.
